# Patient Record
Sex: Female | Born: 1995 | Race: White | Hispanic: No | Marital: Single | Smoking: Never smoker
Health system: Southern US, Community
[De-identification: ages and names within clinical notes are randomized; demographics above are authoritative.]

## PROBLEM LIST (undated history)

## (undated) DIAGNOSIS — R569 Unspecified convulsions: Secondary | ICD-10-CM

## (undated) DIAGNOSIS — C4491 Basal cell carcinoma of skin, unspecified: Secondary | ICD-10-CM

## (undated) DIAGNOSIS — F329 Major depressive disorder, single episode, unspecified: Secondary | ICD-10-CM

## (undated) DIAGNOSIS — N920 Excessive and frequent menstruation with regular cycle: Secondary | ICD-10-CM

## (undated) DIAGNOSIS — G43909 Migraine, unspecified, not intractable, without status migrainosus: Secondary | ICD-10-CM

## (undated) DIAGNOSIS — F32A Depression, unspecified: Secondary | ICD-10-CM

## (undated) HISTORY — DX: Basal cell carcinoma of skin, unspecified: C44.91

## (undated) HISTORY — DX: Unspecified convulsions: R56.9

## (undated) HISTORY — DX: Excessive and frequent menstruation with regular cycle: N92.0

## (undated) HISTORY — PX: WISDOM TOOTH EXTRACTION: SHX21

## (undated) HISTORY — PX: BASAL CELL CARCINOMA EXCISION: SHX1214

## (undated) HISTORY — DX: Migraine, unspecified, not intractable, without status migrainosus: G43.909

---
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
## 2014-02-10 ENCOUNTER — Encounter: Payer: Self-pay | Admitting: Nurse Practitioner

## 2014-02-10 ENCOUNTER — Ambulatory Visit (INDEPENDENT_AMBULATORY_CARE_PROVIDER_SITE_OTHER): Payer: Managed Care, Other (non HMO) | Admitting: Nurse Practitioner

## 2014-02-10 VITALS — BP 116/64 | HR 64 | Ht 61.75 in | Wt 134.0 lb

## 2014-02-10 DIAGNOSIS — F439 Reaction to severe stress, unspecified: Secondary | ICD-10-CM

## 2014-02-10 DIAGNOSIS — Z309 Encounter for contraceptive management, unspecified: Secondary | ICD-10-CM

## 2014-02-10 DIAGNOSIS — Z733 Stress, not elsewhere classified: Secondary | ICD-10-CM

## 2014-02-10 MED ORDER — NORETHIN ACE-ETH ESTRAD-FE 1-20 MG-MCG PO TABS: 1.0000 | ORAL_TABLET | Freq: Every day | ORAL | Status: DC

## 2014-02-10 MED ORDER — SERTRALINE HCL 50 MG PO TABS: 50.0000 mg | ORAL_TABLET | Freq: Every day | ORAL | Status: DC

## 2014-02-10 NOTE — Patient Instructions (Signed)
Check on insurance coverage for Gardasil. - 3 injections.  Call for nurse visit to get Gardasil.

## 2014-02-10 NOTE — Progress Notes (Signed)
Patient ID: Erica Duarte, female   DOB: [birth year], 18 y.o.   MRN: [id number] S: 18 y.o. G0P0 Single Caucasian Fe here for consult visit for OCP.  Since on OCP- Azurette she is having regular menses. But in addition is now having increase in cramps and in mood changes.  Wants to change OCP.  LMP 02/02/14 and was normal.  Same partner since January.    Also to discuss her Zoloft.  She feels some better with stress and feels overall less tense.  Seems like dose in too low at 25 mg and wants to go up.  Sleep is better.  Takes her med's every am.  Plans to work part time and school part time for the summer.  She was having trouble dealing the first year of college and feels like summer school would be helpful.  She denies suicidal ideation.  She was going to check on Gardasil but has not contacted her parents insurance company.    O: Alert and oriented.  Able to express her concerns.  Does not appear anxious today.   Normal thought and affect.  Denies suicidal ideation.     A: Contraception with increase of cramps and PMS on this OCP  Situational stressors dealing with school  P: Will change her OCP to Junel Fe 1/20 for next 3 months and see how she does - if not tolerated to call back.  Will increase her Zoloft to 50 mg daily for next 3 months the consult visit again.  She will call back if her depression or anxiety symptoms worsens  She will also contact insurance company about Gardasil  Consult visit:  15 minutes face to face

## 2014-02-20 NOTE — Progress Notes (Signed)
Encounter reviewed by Dr. Brook Silva.  

## 2014-03-12 ENCOUNTER — Other Ambulatory Visit: Payer: Self-pay | Admitting: Nurse Practitioner

## 2014-03-14 NOTE — Telephone Encounter (Signed)
Last AEX: 11/09/13 Last refill: 02/10/14 #28 0 refill 3 month check: 05/13/14  Per last appt with Patty (02/10/14), it stated pt was going to try the OCP for 3 mths. Ordered 2 motre refills until her next appt   Encounter closed

## 2014-05-13 ENCOUNTER — Ambulatory Visit: Payer: Managed Care, Other (non HMO) | Admitting: Nurse Practitioner

## 2014-05-13 ENCOUNTER — Telehealth: Payer: Self-pay | Admitting: Nurse Practitioner

## 2014-05-13 NOTE — Telephone Encounter (Signed)
Pt DNKA  For reck appointment rescheduled to 05/19/14 with PG. [facility] fee applied.

## 2014-05-19 ENCOUNTER — Encounter: Payer: Self-pay | Admitting: Nurse Practitioner

## 2014-05-19 ENCOUNTER — Ambulatory Visit (INDEPENDENT_AMBULATORY_CARE_PROVIDER_SITE_OTHER): Payer: Managed Care, Other (non HMO) | Admitting: Nurse Practitioner

## 2014-05-19 VITALS — BP 116/72 | HR 72 | Ht 61.75 in | Wt 122.0 lb

## 2014-05-19 DIAGNOSIS — Z733 Stress, not elsewhere classified: Secondary | ICD-10-CM

## 2014-05-19 DIAGNOSIS — F439 Reaction to severe stress, unspecified: Secondary | ICD-10-CM

## 2014-05-19 DIAGNOSIS — Z3041 Encounter for surveillance of contraceptive pills: Secondary | ICD-10-CM

## 2014-05-19 MED ORDER — NORETHINDRONE ACET-ETHINYL EST 1.5-30 MG-MCG PO TABS: 1.0000 | ORAL_TABLET | Freq: Every day | ORAL | Status: DC

## 2014-05-19 MED ORDER — SERTRALINE HCL 25 MG PO TABS: 25.0000 mg | ORAL_TABLET | Freq: Every day | ORAL | Status: DC

## 2014-05-19 NOTE — Patient Instructions (Signed)
If problems call back.  Will try an increase in birth control pills for 3 months

## 2014-05-19 NOTE — Progress Notes (Signed)
Patient ID: Erica Duarte, female   DOB: September 03, 1996, 18 y.o.   MRN: [id number] S: 18 yo SW Few here to discuss her use of Zoloft. She feels some better with stress and feels overall less tense.  She did try 50 mg as suggested by her therapist but felt like it was too much and went back to 25 mg.  She is now much better.  Even with school starting back she has done well.  Recently some insomnia issues that are better with Tylenol PM prn.  Most of stressors is family related.  No other SE.  She also wants to change OCP to a stronger dose as she is having mid cycle BTB.  She denies having missed or late pills. No change of partner/ STD risk.  O: Well oriented.  Denies suicidal thoughts.  Appears to be calm and less tense.  A: Situational  Stressors and doing well on low dose Zoloft  Continues to see therapist at school  OCP for contraception with BTB  Plan:  Will continue with 25 mg Zoloft  Refill Zoloft 25 mg daily until next AEX   Will try 3 months of Loestrin 1.5/30 and see if BTB is better  Consult time: 15 minutes face to face

## 2014-05-22 NOTE — Progress Notes (Signed)
Encounter reviewed by Dr. Josefa Half. Will also refer patient to psychiatry for anxiety and continued care for this.

## 2014-08-22 ENCOUNTER — Other Ambulatory Visit: Payer: Self-pay | Admitting: Nurse Practitioner

## 2014-08-22 NOTE — Telephone Encounter (Signed)
Medication refill request: Loestrin 1.5/30 mg Last AEX:  11/09/13 with Ms. Patty Next AEX: No AEX scheduled for 2016 but is due in March of 2016 Last MMG (if hormonal medication request): N/A  Refill authorized: #90/1 rfs   Patient was seen for OV and her dosage of Loestrin was increased to help with her BTB.  S/w patient she says her BTB has improved tremendously.  S/w Ms. Patty she said it's okay for patient to have refills until her AEX in March 2016  Loestrin 1.5/30 mg #90/1 rfs sent to [facility], patient is aware.  Routed to provider for review, encounter closed.

## 2015-03-22 ENCOUNTER — Other Ambulatory Visit: Payer: Self-pay | Admitting: Nurse Practitioner

## 2015-03-22 NOTE — Telephone Encounter (Signed)
Patient called me back and I scheduled her for AEX 03/24/15 with Ms. Patty, patient said she just ran out of her zoloft today and is needing a refill, okay to send?

## 2015-03-22 NOTE — Telephone Encounter (Signed)
Medication refill request: Zoloft 25 mg  Last AEX:  11/09/13 with PG Next AEX: No AEX scheduled  Last MMG (if hormonal medication request): n/a Refill authorized: ?  Left Message To Call Back Re: Patient needs aex scheduled

## 2015-03-24 ENCOUNTER — Ambulatory Visit (INDEPENDENT_AMBULATORY_CARE_PROVIDER_SITE_OTHER): Payer: Managed Care, Other (non HMO) | Admitting: Nurse Practitioner

## 2015-03-24 ENCOUNTER — Other Ambulatory Visit: Payer: Self-pay | Admitting: Nurse Practitioner

## 2015-03-24 ENCOUNTER — Encounter: Payer: Self-pay | Admitting: Nurse Practitioner

## 2015-03-24 VITALS — BP 100/60 | HR 64 | Ht 61.75 in | Wt 116.0 lb

## 2015-03-24 DIAGNOSIS — Z0000 Encounter for general adult medical examination without abnormal findings: Secondary | ICD-10-CM | POA: Diagnosis not present

## 2015-03-24 DIAGNOSIS — Z01419 Encounter for gynecological examination (general) (routine) without abnormal findings: Secondary | ICD-10-CM | POA: Diagnosis not present

## 2015-03-24 LAB — POCT URINALYSIS DIPSTICK
Bilirubin, UA: NEGATIVE
Blood, UA: NEGATIVE
GLUCOSE UA: NEGATIVE
KETONES UA: NEGATIVE
LEUKOCYTES UA: NEGATIVE
Nitrite, UA: NEGATIVE
PH UA: 6.5
Protein, UA: NEGATIVE
Urobilinogen, UA: NEGATIVE

## 2015-03-24 MED ORDER — NORETHINDRONE ACET-ETHINYL EST 1.5-30 MG-MCG PO TABS: 1.0000 | ORAL_TABLET | Freq: Every day | ORAL | Status: DC

## 2015-03-24 MED ORDER — SERTRALINE HCL 25 MG PO TABS: 25.0000 mg | ORAL_TABLET | Freq: Every day | ORAL | Status: DC

## 2015-03-24 NOTE — Progress Notes (Signed)
Patient ID: Erica Duarte, female   DOB: 08/28/1996, 19 y.o.   MRN: [id number] 19 y.o. G0P0 Single  Caucasian Fe here for annual exam.  Menses at 4 days.  Flow is light, cramps ar minimal.  Same partner over a year.  Still feels well on Zoloft and no suicidal ideations.  Feels less tense about college. Doing well in college with GPA 3.5. States [facility] of anxiety with all siblings.   Patient's last menstrual period was 03/09/2015 (exact date).          Sexually active: Yes.  Same partner since last AEX. The current method of family planning is OCP (estrogen/progesterone).    Exercising: No.  The patient does not participate in regular exercise at present. Smoker:  no  Health Maintenance: Pap:  Never  TDaP:  03/2013 Labs:  HB:  12.5   Urine:  Negative    reports that she has never smoked. She has never used smokeless tobacco. She reports that she drinks alcohol. She reports that she does not use illicit drugs.  Past Medical History  Diagnosis Date  . Menorrhagia     Past Surgical History  Procedure Laterality Date  . Wisdom tooth extraction      Current Outpatient Prescriptions  Medication Sig Dispense Refill  . Norethindrone Acetate-Ethinyl Estradiol (MICROGESTIN) 1.5-30 MG-MCG tablet Take 1 tablet by mouth daily. 90 tablet 3  . sertraline (ZOLOFT) 25 MG tablet Take 1 tablet (25 mg total) by mouth daily. 90 tablet 3   No current facility-administered medications for this visit.    Family History  Problem Relation Age of Onset  . Hypertension Mother   . Thyroid disease Mother     hypo  . Heart attack Maternal Aunt   . Thyroid disease Maternal Aunt     hyper  . Breast cancer Maternal Grandmother 60  . Depression Maternal Grandmother   . Heart disease Maternal Grandfather     ROS:  Pertinent items are noted in HPI.  Otherwise, a comprehensive ROS was negative.  Exam:   BP 100/60 mmHg  Pulse 64  Ht 5' 1.75" (1.568 m)  Wt 116 lb (52.617 kg)  BMI 21.40 kg/m2  LMP 03/09/2015  (Exact Date) Height: 5' 1.75" (156.8 cm) Ht Readings from Last 3 Encounters:  03/24/15 5' 1.75" (1.568 m) (16 %*, Z = -1.00)  05/19/14 5' 1.75" (1.568 m) (16 %*, Z = -0.99)  02/10/14 5' 1.75" (1.568 m) (16 %*, Z = -0.99)   * Growth percentiles are based on CDC 2-20 Years data.    General appearance: alert, cooperative and appears stated age Head: Normocephalic, without obvious abnormality, atraumatic Neck: no adenopathy, supple, symmetrical, trachea midline and thyroid normal to inspection and palpation Lungs: clear to auscultation bilaterally Breasts: normal appearance, no masses or tenderness Heart: regular rate and rhythm Abdomen: soft, non-tender; no masses,  no organomegaly Extremities: extremities normal, atraumatic, no cyanosis or edema Skin: Skin color, texture, turgor normal. No rashes or lesions Lymph nodes: Cervical, supraclavicular, and axillary nodes normal. No abnormal inguinal nodes palpated Neurologic: Grossly normal   Pelvic: External genitalia:  no lesions              Urethra:  normal appearing urethra with no masses, tenderness or lesions              Bartholin's and Skene's: normal                 Vagina: normal appearing vagina with normal color and discharge, no  lesions              Cervix: anteverted              Pap taken: No. Bimanual Exam:  Uterus:  normal size, contour, position, consistency, mobility, non-tender              Adnexa: no mass, fullness, tenderness               Rectovaginal: Confirms               Anus:  normal sphincter tone, no lesions  Chaperone present:  no  A:  Well Woman with normal exam  Contraception needs Situational anxiety with college   P:   Reviewed health and wellness pertinent to exam  Pap smear as above  Declines need for STD's  Refill on OCP  Microgestin  X 1 year  Refill on Zoloft 25 mg daily - she is very aware to call back if any worsening of anxiety or depression.  Counseled on breast self exam,  STD prevention, HIV risk factors and prevention, use and side effects of TOCP's, adequate intake of calcium and vitamin D, diet and exercise return annually or prn  An After Visit Summary was printed and given to the patient.

## 2015-03-24 NOTE — Patient Instructions (Signed)
General topics  Next pap or exam is  due in 1 year Take a Women's multivitamin Take 1200 mg. of calcium daily - prefer dietary If any concerns in interim to call back  Breast Self-Awareness Practicing breast self-awareness may pick up problems early, prevent significant medical complications, and possibly save your life. By practicing breast self-awareness, you can become familiar with how your breasts look and feel and if your breasts are changing. This allows you to notice changes early. It can also offer you some reassurance that your breast health is good. One way to learn what is normal for your breasts and whether your breasts are changing is to do a breast self-exam. If you find a lump or something that was not present in the past, it is best to contact your caregiver right away. Other findings that should be evaluated by your caregiver include nipple discharge, especially if it is bloody; skin changes or reddening; areas where the skin seems to be pulled in (retracted); or new lumps and bumps. Breast pain is seldom associated with cancer (malignancy), but should also be evaluated by a caregiver. BREAST SELF-EXAM The best time to examine your breasts is 5 7 days after your menstrual period is over.  ExitCare Patient Information 2013 ExitCare[employer].   Exercise to Stay Healthy Exercise helps you become and stay healthy. EXERCISE IDEAS AND TIPS Choose exercises that:  You enjoy.  Fit into your day. You do not need to exercise really hard to be healthy. You can do exercises at a slow or medium level and stay healthy. You can:  Stretch before and after working out.  Try yoga, Pilates, or tai chi.  Lift weights.  Walk fast, swim, jog, run, climb stairs, bicycle, dance, or rollerskate.  Take aerobic classes. Exercises that burn about 150 calories:  Running 1  miles in 15 minutes.  Playing volleyball for 45 to 60 minutes.  Washing and waxing a car for 45 to 60  minutes.  Playing touch football for 45 minutes.  Walking 1  miles in 35 minutes.  Pushing a stroller 1  miles in 30 minutes.  Playing basketball for 30 minutes.  Raking leaves for 30 minutes.  Bicycling 5 miles in 30 minutes.  Walking 2 miles in 30 minutes.  Dancing for 30 minutes.  Shoveling snow for 15 minutes.  Swimming laps for 20 minutes.  Walking up stairs for 15 minutes.  Bicycling 4 miles in 15 minutes.  Gardening for 30 to 45 minutes.  Jumping rope for 15 minutes.  Washing windows or floors for 45 to 60 minutes. Document Released: 09/28/2010 Document Revised: 11/18/2011 Document Reviewed: 09/28/2010 ExitCare Patient Information 2013 ExitCare[employer].   Other topics ( that may be useful information):    Sexually Transmitted Disease Sexually transmitted disease (STD) refers to any infection that is passed from person to person during sexual activity. This may happen by way of saliva, semen, blood, vaginal mucus, or urine. Common STDs include:  Gonorrhea.  Chlamydia.  Syphilis.  HIV/AIDS.  Genital herpes.  Hepatitis B and C.  Trichomonas.  Human papillomavirus (HPV).  Pubic lice. CAUSES  An STD may be spread by bacteria, virus, or parasite. A person can get an STD by:  Sexual intercourse with an infected person.  Sharing sex toys with an infected person.  Sharing needles with an infected person.  Having intimate contact with the genitals, mouth, or rectal areas of an infected person. SYMPTOMS  Some people may not have any symptoms, but   they can still pass the infection to others. Different STDs have different symptoms. Symptoms include:  Painful or bloody urination.  Pain in the pelvis, abdomen, vagina, anus, throat, or eyes.  Skin rash, itching, irritation, growths, or sores (lesions). These usually occur in the genital or anal area.  Abnormal vaginal discharge.  Penile discharge in men.  Soft, flesh-colored skin growths in the  genital or anal area.  Fever.  Pain or bleeding during sexual intercourse.  Swollen glands in the groin area.  Yellow skin and eyes (jaundice). This is seen with hepatitis. DIAGNOSIS  To make a diagnosis, your caregiver may:  Take a medical history.  Perform a physical exam.  Take a specimen (culture) to be examined.  Examine a sample of discharge under a microscope.  Perform blood test TREATMENT   Chlamydia, gonorrhea, trichomonas, and syphilis can be cured with antibiotic medicine.  Genital herpes, hepatitis, and HIV can be treated, but not cured, with prescribed medicines. The medicines will lessen the symptoms.  Genital warts from HPV can be treated with medicine or by freezing, burning (electrocautery), or surgery. Warts may come back.  HPV is a virus and cannot be cured with medicine or surgery.However, abnormal areas may be followed very closely by your caregiver and may be removed from the cervix, vagina, or vulva through office procedures or surgery. If your diagnosis is confirmed, your recent sexual partners need treatment. This is true even if they are symptom-free or have a negative culture or evaluation. They should not have sex until their caregiver says it is okay. HOME CARE INSTRUCTIONS  All sexual partners should be informed, tested, and treated for all STDs.  Take your antibiotics as directed. Finish them even if you start to feel better.  Only take over-the-counter or prescription medicines for pain, discomfort, or fever as directed by your caregiver.  Rest.  Eat a balanced diet and drink enough fluids to keep your urine clear or pale yellow.  Do not have sex until treatment is completed and you have followed up with your caregiver. STDs should be checked after treatment.  Keep all follow-up appointments, Pap tests, and blood tests as directed by your caregiver.  Only use latex condoms and water-soluble lubricants during sexual activity. Do not use  petroleum jelly or oils.  Avoid alcohol and illegal drugs.  Get vaccinated for HPV and hepatitis. If you have not received these vaccines in the past, talk to your caregiver about whether one or both might be right for you.  Avoid risky sex practices that can break the skin. The only way to avoid getting an STD is to avoid all sexual activity.Latex condoms and dental dams (for oral sex) will help lessen the risk of getting an STD, but will not completely eliminate the risk. SEEK MEDICAL CARE IF:   You have a fever.  You have any new or worsening symptoms. Document Released: 11/16/2002 Document Revised: 11/18/2011 Document Reviewed: 11/23/2010 [facility] Patient Information 2013 [employer].    Domestic Abuse You are being battered or abused if someone close to you hits, pushes, or physically hurts you in any way. You also are being abused if you are forced into activities. You are being sexually abused if you are forced to have sexual contact of any kind. You are being emotionally abused if you are made to feel worthless or if you are constantly threatened. It is important to remember that help is available. No one has the right to abuse you. PREVENTION OF FURTHER  ABUSE  Learn the warning signs of danger. This varies with situations but may include: the use of alcohol, threats, isolation from friends and family, or forced sexual contact. Leave if you feel that violence is going to occur.  If you are attacked or beaten, report it to the police so the abuse is documented. You do not have to press charges. The police can protect you while you or the attackers are leaving. Get the officer's name and badge number and a copy of the report.  Find someone you can trust and tell them what is happening to you: your caregiver, a nurse, clergy member, close friend or family member. Feeling ashamed is natural, but remember that you have done nothing wrong. No one deserves abuse. Document Released:  08/23/2000 Document Revised: 11/18/2011 Document Reviewed: 11/01/2010 ExitCare Patient Information 2013 [employer]Care[employer].    How Much is Too Much Alcohol? Drinking too much alcohol can cause injury, accidents, and health problems. These types of problems can include:   Car crashes.  Falls.  Family fighting (domestic violence).  Drowning.  Fights.  Injuries.  Burns.  Damage to certain organs.  Having a baby with birth defects. ONE DRINK CAN BE TOO MUCH WHEN YOU ARE:  Working.  Pregnant or breastfeeding.  Taking medicines. Ask your doctor.  Driving or planning to drive. If you or someone you know has a drinking problem, get help from a doctor.  Document Released: 06/22/2009 Document Revised: 11/18/2011 Document Reviewed: 06/22/2009 ExitCare Patient Information 2013 [employer]Care[employer].   Smoking Hazards Smoking cigarettes is extremely bad for your health. Tobacco smoke has over 200 known poisons in it. There are over 60 chemicals in tobacco smoke that cause cancer. Some of the chemicals found in cigarette smoke include:   Cyanide.  Benzene.  Formaldehyde.  Methanol (wood alcohol).  Acetylene (fuel used in welding torches).  Ammonia. Cigarette smoke also contains the poisonous gases nitrogen oxide and carbon monoxide.  Cigarette smokers have an increased risk of many serious medical problems and Smoking causes approximately:  90% of all lung cancer deaths in men.  80% of all lung cancer deaths in women.  90% of deaths from chronic obstructive lung disease. Compared with nonsmokers, smoking increases the risk of:  Coronary heart disease by 2 to 4 times.  Stroke by 2 to 4 times.  Men developing lung cancer by 23 times.  Women developing lung cancer by 13 times.  Dying from chronic obstructive lung diseases by 12 times.  . Smoking is the most preventable cause of death and disease in our society.  WHY IS SMOKING ADDICTIVE?  Nicotine is the chemical  agent in tobacco that is capable of causing addiction or dependence.  When you smoke and inhale, nicotine is absorbed rapidly into the bloodstream through your lungs. Nicotine absorbed through the lungs is capable of creating a powerful addiction. Both inhaled and non-inhaled nicotine may be addictive.  Addiction studies of cigarettes and spit tobacco show that addiction to nicotine occurs mainly during the teen years, when young people begin using tobacco products. WHAT ARE THE BENEFITS OF QUITTING?  There are many health benefits to quitting smoking.   Likelihood of developing cancer and heart disease decreases. Health improvements are seen almost immediately.  Blood pressure, pulse rate, and breathing patterns start returning to normal soon after quitting. QUITTING SMOKING   American Lung Association - 1-800-LUNGUSA  American Cancer Society - 1-800-ACS-2345 Document Released: 10/03/2004 Document Revised: 11/18/2011 Document Reviewed: 06/07/2009 ExitCare Patient Information 2013 ExitCare,   LLC.   Stress Management Stress is a state of physical or mental tension that often results from changes in your life or normal routine. Some common causes of stress are:  Death of a loved one.  Injuries or severe illnesses.  Getting fired or changing jobs.  Moving into a new home. Other causes may be:  Sexual problems.  Business or financial losses.  Taking on a large debt.  Regular conflict with someone at home or at work.  Constant tiredness from lack of sleep. It is not just bad things that are stressful. It may be stressful to:  Win the lottery.  Get married.  Buy a new car. The amount of stress that can be easily tolerated varies from person to person. Changes generally cause stress, regardless of the types of change. Too much stress can affect your health. It may lead to physical or emotional problems. Too little stress (boredom) may also become stressful. SUGGESTIONS TO  REDUCE STRESS:  Talk things over with your family and friends. It often is helpful to share your concerns and worries. If you feel your problem is serious, you may want to get help from a professional counselor.  Consider your problems one at a time instead of lumping them all together. Trying to take care of everything at once may seem impossible. List all the things you need to do and then start with the most important one. Set a goal to accomplish 2 or 3 things each day. If you expect to do too many in a single day you will naturally fail, causing you to feel even more stressed.  Do not use alcohol or drugs to relieve stress. Although you may feel better for a short time, they do not remove the problems that caused the stress. They can also be habit forming.  Exercise regularly - at least 3 times per week. Physical exercise can help to relieve that "uptight" feeling and will relax you.  The shortest distance between despair and hope is often a good night's sleep.  Go to bed and get up on time allowing yourself time for appointments without being rushed.  Take a short "time-out" period from any stressful situation that occurs during the day. Close your eyes and take some deep breaths. Starting with the muscles in your face, tense them, hold it for a few seconds, then relax. Repeat this with the muscles in your neck, shoulders, hand, stomach, back and legs.  Take good care of yourself. Eat a balanced diet and get plenty of rest.  Schedule time for having fun. Take a break from your daily routine to relax. HOME CARE INSTRUCTIONS   Call if you feel overwhelmed by your problems and feel you can no longer manage them on your own.  Return immediately if you feel like hurting yourself or someone else. Document Released: 02/19/2001 Document Revised: 11/18/2011 Document Reviewed: 10/12/2007 ExitCare Patient Information 2013 ExitCare, LLC.  

## 2015-03-27 LAB — HEMOGLOBIN, FINGERSTICK: Hemoglobin, fingerstick: 12.5 g/dL (ref 12.0–16.0)

## 2015-03-28 NOTE — Progress Notes (Signed)
Encounter reviewed by Dr. Brook Amundson C. Silva.  

## 2015-07-03 ENCOUNTER — Other Ambulatory Visit: Payer: Self-pay | Admitting: *Deleted

## 2015-07-03 MED ORDER — NORETHINDRONE ACET-ETHINYL EST 1.5-30 MG-MCG PO TABS: 1.0000 | ORAL_TABLET | Freq: Every day | ORAL | Status: DC

## 2015-07-03 MED ORDER — SERTRALINE HCL 25 MG PO TABS: 25.0000 mg | ORAL_TABLET | Freq: Every day | ORAL | Status: DC

## 2015-07-03 NOTE — Telephone Encounter (Signed)
Medication refill request: sertraline and Microgestin  Last AEX:  03/24/15 PG Next AEX: 04/01/16 PG Last MMG (if hormonal medication request): none Refill authorized: both refills 03/24/15 #90tabs / 3R to [facility].  Request coming from [facility]  Both Rx approved to [facility] with refills to last until 03/2016 as prescribed.

## 2016-02-27 ENCOUNTER — Other Ambulatory Visit: Payer: Self-pay | Admitting: Nurse Practitioner

## 2016-02-27 NOTE — Telephone Encounter (Signed)
Medication refill request: Zoloft Last AEX:  03-24-15  Next AEX: 04-01-16 Last MMG (if hormonal medication request): N/A Refill authorized: please advise

## 2016-03-03 ENCOUNTER — Emergency Department (HOSPITAL_COMMUNITY): Payer: 59

## 2016-03-03 ENCOUNTER — Encounter (HOSPITAL_COMMUNITY): Payer: Self-pay

## 2016-03-03 ENCOUNTER — Observation Stay (HOSPITAL_COMMUNITY)
Admission: EM | Admit: 2016-03-03 | Discharge: 2016-03-04 | Disposition: A | Discharge status: Home / Self Care | Payer: 59 | Attending: Internal Medicine | Admitting: Internal Medicine

## 2016-03-03 DIAGNOSIS — F329 Major depressive disorder, single episode, unspecified: Secondary | ICD-10-CM | POA: Insufficient documentation

## 2016-03-03 DIAGNOSIS — F32A Depression, unspecified: Secondary | ICD-10-CM | POA: Diagnosis present

## 2016-03-03 DIAGNOSIS — G43109 Migraine with aura, not intractable, without status migrainosus: Secondary | ICD-10-CM | POA: Diagnosis present

## 2016-03-03 DIAGNOSIS — G43909 Migraine, unspecified, not intractable, without status migrainosus: Secondary | ICD-10-CM | POA: Diagnosis not present

## 2016-03-03 DIAGNOSIS — F1290 Cannabis use, unspecified, uncomplicated: Secondary | ICD-10-CM | POA: Insufficient documentation

## 2016-03-03 DIAGNOSIS — F419 Anxiety disorder, unspecified: Secondary | ICD-10-CM | POA: Diagnosis present

## 2016-03-03 DIAGNOSIS — R569 Unspecified convulsions: Secondary | ICD-10-CM | POA: Diagnosis not present

## 2016-03-03 HISTORY — DX: Major depressive disorder, single episode, unspecified: F32.9

## 2016-03-03 HISTORY — DX: Depression, unspecified: F32.A

## 2016-03-03 LAB — RAPID URINE DRUG SCREEN, HOSP PERFORMED
Amphetamines: NOT DETECTED
Barbiturates: NOT DETECTED
Benzodiazepines: NOT DETECTED
Cocaine: NOT DETECTED
Opiates: NOT DETECTED
Tetrahydrocannabinol: POSITIVE — AB

## 2016-03-03 LAB — BASIC METABOLIC PANEL
Anion gap: 9 (ref 5–15)
BUN: 7 mg/dL (ref 6–20)
CO2: 22 mmol/L (ref 22–32)
Calcium: 9.3 mg/dL (ref 8.9–10.3)
Chloride: 104 mmol/L (ref 101–111)
Creatinine, Ser: 0.66 mg/dL (ref 0.44–1.00)
GFR calc Af Amer: >60 mL/min (ref >60)
GLUCOSE: 87 mg/dL (ref 65–99)
Potassium: 5 mmol/L (ref 3.5–5.1)
Sodium: 135 mmol/L (ref 135–145)

## 2016-03-03 LAB — CBC WITH DIFFERENTIAL/PLATELET
BASOS ABS: 0 10*3/uL (ref 0.0–0.1)
Basophils Relative: 0 %
EOS PCT: 0 %
Eosinophils Absolute: 0 10*3/uL (ref 0.0–0.7)
HCT: 39.9 % (ref 36.0–46.0)
Hemoglobin: 13.5 g/dL (ref 12.0–15.0)
LYMPHS ABS: 1.3 10*3/uL (ref 0.7–4.0)
LYMPHS PCT: 31 %
MCH: 30.3 pg (ref 26.0–34.0)
MCHC: 33.8 g/dL (ref 30.0–36.0)
MCV: 89.5 fL (ref 78.0–100.0)
MONO ABS: 0.2 10*3/uL (ref 0.1–1.0)
Monocytes Relative: 5 %
Neutro Abs: 2.8 10*3/uL (ref 1.7–7.7)
Neutrophils Relative %: 64 %
PLATELETS: 212 10*3/uL (ref 150–400)
RBC: 4.46 MIL/uL (ref 3.87–5.11)
RDW: 13.2 % (ref 11.5–15.5)
WBC: 4.3 10*3/uL (ref 4.0–10.5)

## 2016-03-03 LAB — TSH: TSH: 0.736 u[IU]/mL (ref 0.350–4.500)

## 2016-03-03 LAB — MAGNESIUM: Magnesium: 2 mg/dL (ref 1.7–2.4)

## 2016-03-03 MED ORDER — ACETAMINOPHEN 650 MG RE SUPP: 650.0000 mg | Freq: Four times a day (QID) | RECTAL | Status: DC | PRN

## 2016-03-03 MED ORDER — ONDANSETRON HCL 4 MG PO TABS: 4.0000 mg | ORAL_TABLET | Freq: Four times a day (QID) | ORAL | Status: DC | PRN

## 2016-03-03 MED ORDER — SODIUM CHLORIDE 0.9 % IV SOLN: 250.0000 mL | INTRAVENOUS | Status: DC | PRN

## 2016-03-03 MED ORDER — NORETHINDRONE ACET-ETHINYL EST 1.5-30 MG-MCG PO TABS: 1.0000 | ORAL_TABLET | Freq: Every day | ORAL | Status: DC

## 2016-03-03 MED ORDER — ONDANSETRON HCL 4 MG/2ML IJ SOLN: 4.0000 mg | Freq: Four times a day (QID) | INTRAMUSCULAR | Status: DC | PRN

## 2016-03-03 MED ORDER — KETOROLAC TROMETHAMINE 30 MG/ML IJ SOLN: 30.0000 mg | Freq: Four times a day (QID) | INTRAMUSCULAR | Status: DC | PRN

## 2016-03-03 MED ORDER — SERTRALINE HCL 50 MG PO TABS
25.0000 mg | ORAL_TABLET | Freq: Every day | ORAL | Status: DC
Start: 2016-03-04 — End: 2016-03-04
  Administered 2016-03-04: 25 mg via ORAL
  Filled 2016-03-03: qty 1

## 2016-03-03 MED ORDER — SODIUM CHLORIDE 0.9% FLUSH
3.0000 mL | Freq: Two times a day (BID) | INTRAVENOUS | Status: DC
  Administered 2016-03-04: 3 mL via INTRAVENOUS

## 2016-03-03 MED ORDER — SODIUM CHLORIDE 0.9% FLUSH
3.0000 mL | Freq: Two times a day (BID) | INTRAVENOUS | Status: DC
  Administered 2016-03-03 – 2016-03-04 (×2): 3 mL via INTRAVENOUS

## 2016-03-03 MED ORDER — SODIUM CHLORIDE 0.9% FLUSH: 3.0000 mL | INTRAVENOUS | Status: DC | PRN

## 2016-03-03 MED ORDER — ACETAMINOPHEN 325 MG PO TABS
650.0000 mg | ORAL_TABLET | Freq: Four times a day (QID) | ORAL | Status: DC | PRN
  Administered 2016-03-03: 650 mg via ORAL
  Filled 2016-03-03: qty 2

## 2016-03-03 NOTE — ED Notes (Signed)
Dinner tray ordered for pt

## 2016-03-03 NOTE — Progress Notes (Addendum)
Patient arrived to unit alert and oriented x 4, pt denies pain. Patient medications and orders reviewed and verbally confirmed by patient. Patient oriented to unit, call light and telephone placed within patients reach. Patient eating dinner. Patient clothing, cell phone/charger and purse at bedside.  Patient states nothing valuable on her that needs to be sent home. Patient calm and cooperative. RN will continue to monitor patient.

## 2016-03-03 NOTE — ED Provider Notes (Signed)
CSN: [id number]     Arrival date & time 03/03/16  1303 History   First MD Initiated Contact with Patient 03/03/16 1304     Chief Complaint  Patient presents with  . Seizures      HPI Pt was in drive through line at [employer], had 4 other people with her, witnessed Seizure. No h/o seizure. Pt reports at onset she was looking right and when trying to look left eyes kept turning back to right, dizziness. Pt is having trouble remembering details, time/date, address.  Patient has had more frequent headaches over the last few weeks.  No fever chills.  No recent head injury. Past Medical History  Diagnosis Date  . Menorrhagia   . Depression   . Seizures [facility])    Past Surgical History  Procedure Laterality Date  . Wisdom tooth extraction     Family History  Problem Relation Age of Onset  . Hypertension Mother   . Thyroid disease Mother     hypo  . Hyperlipidemia Mother   . Heart attack Maternal Aunt   . Thyroid disease Maternal Aunt     hyper  . Breast cancer Maternal Grandmother 60  . Depression Maternal Grandmother   . Epilepsy Maternal Grandmother   . Heart disease Maternal Grandfather    Social History  Substance Use Topics  . Smoking status: Never Smoker   . Smokeless tobacco: Never Used  . Alcohol Use: 0.0 oz/week    0 Standard drinks or equivalent per week     Comment: Occasional social use.   OB History    Gravida Para Term Preterm AB TAB SAB Ectopic Multiple Living   0 0             Review of Systems All other systems reviewed and are negative   Allergies  Review of patient's allergies indicates no known allergies.  Home Medications   Prior to Admission medications   Medication Sig Start Date End Date Taking? Authorizing Provider  Norethindrone Acetate-Ethinyl Estradiol (MICROGESTIN) 1.5-30 MG-MCG tablet Take 1 tablet by mouth daily. 07/03/15  Yes Kem Boroughs, FNP  sertraline (ZOLOFT) 25 MG tablet Take 1 tablet by mouth  daily 02/27/16  Yes Kem Boroughs, FNP   BP 110/60 mmHg  Pulse 67  Temp(Src) 98 F (36.7 C) (Oral)  Resp 20  Ht 5\' 1"  (1.549 m)  Wt 107 lb 9.4 oz (48.8 kg)  BMI 20.34 kg/m2  SpO2 98%  LMP 02/28/2016 Physical Exam Physical Exam  Nursing note and vitals reviewed. Constitutional: She is oriented to person, place, and time. She appears well-developed and well-nourished. No distress.  HENT:  Head: Normocephalic and atraumatic.  Eyes: Pupils are equal, round, and reactive to light.  Neck: Normal range of motion.  Cardiovascular: Normal rate and intact distal pulses.   Pulmonary/Chest: No respiratory distress.  Abdominal: Normal appearance. She exhibits no distension.  Musculoskeletal: Normal range of motion.  Neurological: She is alert and oriented to person, place, and time. No cranial nerve deficit.  no motor or sensory deficit.  Gait is normal. Skin: Skin is warm and dry. No rash noted.  Psychiatric: She has a normal mood and affect. Her behavior is normal.   ED Course  Procedures (including critical care time) Labs Review  Results for orders placed or performed during the hospital encounter of [id number]  Basic metabolic panel  Result Value Ref Range   Sodium 135 135 - 145 mmol/L   Potassium 5.0 3.5 - 5.1 mmol/L  Chloride 104 101 - 111 mmol/L   CO2 22 22 - 32 mmol/L   Glucose, Bld 87 65 - 99 mg/dL   BUN 7 6 - 20 mg/dL   Creatinine, Ser 0.66 0.44 - 1.00 mg/dL   Calcium 9.3 8.9 - 10.3 mg/dL   GFR calc non Af Amer >60 >60 mL/min   GFR calc Af Amer >60 >60 mL/min   Anion gap 9 5 - 15  CBC with Differential/Platelet  Result Value Ref Range   WBC 4.3 4.0 - 10.5 K/uL   RBC 4.46 3.87 - 5.11 MIL/uL   Hemoglobin 13.5 12.0 - 15.0 g/dL   HCT 39.9 36.0 - 46.0 %   MCV 89.5 78.0 - 100.0 fL   MCH 30.3 26.0 - 34.0 pg   MCHC 33.8 30.0 - 36.0 g/dL   RDW 13.2 11.5 - 15.5 %   Platelets 212 150 - 400 K/uL   Neutrophils Relative % 64 %   Neutro Abs 2.8 1.7 - 7.7 K/uL   Lymphocytes Relative 31 %   Lymphs Abs  1.3 0.7 - 4.0 K/uL   Monocytes Relative 5 %   Monocytes Absolute 0.2 0.1 - 1.0 K/uL   Eosinophils Relative 0 %   Eosinophils Absolute 0.0 0.0 - 0.7 K/uL   Basophils Relative 0 %   Basophils Absolute 0.0 0.0 - 0.1 K/uL  Urine rapid drug screen (hosp performed)  Result Value Ref Range   Opiates NONE DETECTED NONE DETECTED   Cocaine NONE DETECTED NONE DETECTED   Benzodiazepines NONE DETECTED NONE DETECTED   Amphetamines NONE DETECTED NONE DETECTED   Tetrahydrocannabinol POSITIVE (A) NONE DETECTED   Barbiturates NONE DETECTED NONE DETECTED  Magnesium  Result Value Ref Range   Magnesium 2.0 1.7 - 2.4 mg/dL  TSH  Result Value Ref Range   TSH 0.736 0.350 - 4.500 uIU/mL   Dg Chest 2 View  03/03/2016  CLINICAL DATA:  Headache.  Seizure. EXAM: CHEST  2 VIEW COMPARISON:  None. FINDINGS: Normal heart size. Normal mediastinal contour. No pneumothorax. No pleural effusion. Lungs appear clear, with no acute consolidative airspace disease and no pulmonary edema. IMPRESSION: No active cardiopulmonary disease. Electronically Signed   By: Ilona Sorrel M.D.   On: 03/03/2016 14:17   Ct Head Wo Contrast  03/03/2016  CLINICAL DATA:  Pt states she remembers looking to right and being unable to look to the left and being unable to speak then was told she had a seizure. Pt has headache all over. EXAM: CT HEAD WITHOUT CONTRAST TECHNIQUE: Contiguous axial images were obtained from the base of the skull through the vertex without intravenous contrast. COMPARISON:  None. FINDINGS: No acute intracranial hemorrhage. No focal mass lesion. No CT evidence of acute infarction. No midline shift or mass effect. No hydrocephalus. Basilar cisterns are patent. Paranasal sinuses and mastoid air cells are clear. Orbits are clear. IMPRESSION: Normal head CT. Electronically Signed   By: Suzy Bouchard M.D.   On: 03/03/2016 15:25      I spoke with the neurologist who will come see the patient in the emergency room and recommend  disposition.  MDM   Final diagnoses:  Seizure ([place])        Leonard Schwartz, MD 03/06/16 [phone]

## 2016-03-03 NOTE — ED Notes (Signed)
Received report from Maryfrances Bunnell, RN

## 2016-03-03 NOTE — ED Notes (Signed)
Dr. Leonel Ramsay in to assess pt at this time.  Pt remains alert and oriented x's 3.  Pt's mom at bedside

## 2016-03-03 NOTE — ED Notes (Signed)
Pt transported to 5M10.  Did not receive call from receiving nurse.  Pt transported to 5M10 on cardiac monitor

## 2016-03-03 NOTE — ED Notes (Signed)
To room via EMS.  Pt was in drive through line at [employer], had 4 other people with her, witnessed  Seizure.  No h/o seizure.  Pt reports at onset she was looking right and when trying to look left eyes kept turning back to right, dizziness.  Pt is having trouble remembering details, time/date, address.

## 2016-03-03 NOTE — H&P (Signed)
History and Physical:    Erica Duarte   [id number] DOB: 04-28-1996 DOA: 03/03/2016  Referring MD/provider: Dr. Wilson Singer PCP: No PCP Per Patient   Patient coming from:  Home  Chief Complaint: Altered mental status  History of Present Illness:   Erica Duarte is an 20 y.o. female with a PMH of depression treated with Zoloft for the past 3 years who was in her usual state of health when she had the sudden onset of altered mental status. She was going through the drive through with friends, and when she attempted to look to the left to place her order, she could not move her gaze in that direction and subsequently felt her head turning to the right. She says she was unable to talk, and heard her friends tell the clerk at the drive through to call an ambulance. She came to when the ambulance arrived. She did not lose bladder or bowel function and there was no mouth trauma. She has never had a similar event, though she does report headaches for the last 1-2 years and occasional episodes of "tunnel vision". None of her friends who witnessed the event were present at the bedside, but her mother says that they told her that Najja said something "off the wall "and began rubbing her eye repetitively. There was no frank tonic-clonic convulsive activity. The patient denies any drug use other than occasional marijuana and occasional alcohol, but not daily use. She has not had any recent medication changes. She has not traveled recently, and has no recent insect bites or rashes on the skin. Family history was positive for a maternal grandmother having epilepsy, and a half-sister having been evaluated for similar vision issues with lapses of time, although no clear diagnosis of seizure was diagnosed.  ED Course: A CT of the head was obtained which was unremarkable. Neurological consultation was requested. No further events were reported.  ROS:   Review of Systems  Constitutional: Positive for diaphoresis.  Negative for fever, chills and malaise/fatigue.       Reported an episode of sweating associated with blurry vision 1 day prior to admission.  HENT: Negative for hearing loss.        Headaches for the last 1-2 years, not associated with menses.  Eyes: Positive for blurred vision. Negative for double vision and pain.  Respiratory: Negative.   Cardiovascular: Negative.   Gastrointestinal: Negative.   Genitourinary: Negative.   Musculoskeletal: Negative.   Skin: Negative.   Neurological: Positive for dizziness, loss of consciousness and headaches. Negative for weakness.  Endo/Heme/Allergies: Negative.   Psychiatric/Behavioral: Positive for depression.    All other systems were reviewed are are negative.  Past Medical History:   Past Medical History  Diagnosis Date  . Menorrhagia   . Depression     Past Surgical History:   Past Surgical History  Procedure Laterality Date  . Wisdom tooth extraction      Social History:   Social History   Social History  . Marital Status: Single    Spouse Name: N/A  . Number of Children: 0  . Years of Education: N/A   Occupational History  . Student    Social History Main Topics  . Smoking status: Never Smoker   . Smokeless tobacco: Never Used  . Alcohol Use: 0.0 oz/week    0 Standard drinks or equivalent per week     Comment: Occasional social use.  . Drug Use: Yes    Special: Marijuana  Comment: Marijuana  . Sexual Activity: Yes    Birth Control/ Protection: Condom, OCP   Other Topics Concern  . 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 She is single.    Allergies   Review of patient's allergies indicates no known allergies.  Family history:   Family History  Problem Relation Age of Onset  . Hypertension Mother   . Thyroid disease Mother     hypo  . Heart attack Maternal Aunt   .  Thyroid disease Maternal Aunt     hyper  . Breast cancer Maternal Grandmother 60  . Depression Maternal Grandmother   . Heart disease Maternal Grandfather   . Epilepsy Maternal Grandmother     Current Medications:   Prior to Admission medications   Medication Sig Start Date End Date Taking? Authorizing Provider  Norethindrone Acetate-Ethinyl Estradiol (MICROGESTIN) 1.5-30 MG-MCG tablet Take 1 tablet by mouth daily. 07/03/15  Yes Kem Boroughs, FNP  sertraline (ZOLOFT) 25 MG tablet Take 1 tablet by mouth  daily 02/27/16  Yes Kem Boroughs, FNP    Physical Exam:   Filed Vitals:   03/03/16 1530 03/03/16 1600 03/03/16 1615 03/03/16 1630  BP: 127/84 114/69  116/73  Pulse: 76 70 72 73  Temp:      TempSrc:      Resp: 15 17 22 14   Height:      Weight:      SpO2: 100% 99% 100% 100%     Physical Exam: Blood pressure 116/73, pulse 73, temperature 97.8 F (36.6 C), temperature source Oral, resp. rate 14, height 5\' 1"  (1.549 m), weight 52.164 kg (115 lb), last menstrual period 02/28/2016, SpO2 100 %. Gen: No acute distress.Well-developed, well-nourished. Head: Normocephalic, atraumatic. Eyes: Pupils equal, round and reactive to light. Extraocular movements intact.  Sclerae nonicteric. Mouth: Oropharynx reveals moist mucous membranes. Neck: Supple, no thyromegaly, no lymphadenopathy, no jugular venous distention. Chest: Lungs are clear to auscultation with good air movement. No rales, rhonchi or wheezes.  CV: Heart sounds are regular with an S1, S2. No murmurs, rubs, clicks, or gallops.  Abdomen: Soft, nontender, nondistended with normal active bowel sounds. Extremities: Extremities are without clubbing, edema, or cyanosis. Pedal pulses 2+.  Skin: Warm and dry. No rashes, lesions or wounds. Tattoo right posterior shoulder. Neuro: Alert and oriented times 3; grossly nonfocal.  Psych: Insight is good and judgment is appropriate. Mood and affect normal.   Data Review:     Labs: Basic Metabolic Panel:  Recent Labs Lab 03/03/16 1319  NA 135  K 5.0  CL 104  CO2 22  GLUCOSE 87  BUN 7  CREATININE 0.66  CALCIUM 9.3   Liver Function Tests: No results for input(s): AST, ALT, ALKPHOS, BILITOT, PROT, ALBUMIN in the last 168 hours. No results for input(s): LIPASE, AMYLASE in the last 168 hours. No results for input(s): AMMONIA in the last 168 hours. CBC:  Recent Labs Lab 03/03/16 1319  WBC 4.3  NEUTROABS 2.8  HGB 13.5  HCT 39.9  MCV 89.5  PLT 212    Urinalysis    Component Value Date/Time   BILIRUBINUR neg 03/24/2015 1636   PROTEINUR neg 03/24/2015 1636   UROBILINOGEN negative 03/24/2015 1636   NITRITE neg 03/24/2015 1636   LEUKOCYTESUR Negative 03/24/2015 1636     Radiographic Studies: Dg Chest 2 View  03/03/2016  CLINICAL DATA:  Headache.  Seizure. EXAM: CHEST  2 VIEW COMPARISON:  None. FINDINGS: Normal heart size. Normal mediastinal contour. No pneumothorax. No pleural effusion. Lungs appear clear, with no acute consolidative airspace disease and no pulmonary edema. IMPRESSION: No active cardiopulmonary disease. Electronically Signed   By: Ilona Sorrel M.D.   On: 03/03/2016 14:17   Ct Head Wo Contrast  03/03/2016  CLINICAL DATA:  Pt states she remembers looking to right and being unable to look to the left and being unable to speak then was told she had a seizure. Pt has headache all over. EXAM: CT HEAD WITHOUT CONTRAST TECHNIQUE: Contiguous axial images were obtained from the base of the skull through the vertex without intravenous contrast. COMPARISON:  None. FINDINGS: No acute intracranial hemorrhage. No focal mass lesion. No CT evidence of acute infarction. No midline shift or mass effect. No hydrocephalus. Basilar cisterns are patent. Paranasal sinuses and mastoid air cells are clear. Orbits are clear. IMPRESSION: Normal head CT. Electronically Signed   By: Suzy Bouchard M.D.   On: 03/03/2016 15:25    EKG: None  performed.   Assessment/Plan:   Principal Problem:   Partial seizure (HCC)versus complicated migraine  Seen by neurologist. Agree with plan to obtain MRI/EEG. No antiepileptic medications ordered at present. Would use Toradol as needed for headache. Avoid opiates.  Active Problems:   Depression Continue Zoloft..  Other information:   DVT prophylaxis: SCDs/early ambulation. Code Status: Full code. Family Communication: Mother updated at the bedside. Disposition Plan: Home 03/04/16. Consults called: Dr. Leonel Ramsay, neurology. Admission status: Observation.  Time spent: One hour.  Matteus Mcnelly [facility] Pager [phone] Cell: [phone]   If 7PM-7AM, please contact night-coverage [email or web address] Password [facility] 03/03/2016, 6:36 PM

## 2016-03-03 NOTE — Consult Note (Signed)
Neurology Consultation Reason for Consult: Seizure Referring Physician: Audie Pinto  CC: Seizure  History is obtained from: Patient  HPI: Erica Duarte is a 20 y.o. female who is sitting down in the car when she had the sensation that she was looking to the right even though she was trying to look to the left. She then felt her head turned to the right. She hurt her friends say that someone need to call 911 missed last thing she remembers before being in the back of an ambulance. Witnesses report seizure activity. She currently has returned to baseline.  She denies any drug use other than occasional marijuana and occasional alcohol.  Of note, her grandmother began getting seizures when she was pregnant with her first child and continued to get seizure throughout her life.  ROS: A 14 point ROS was performed and is negative except as noted in the HPI.   Past Medical History  Diagnosis Date  . Menorrhagia      Family History  Problem Relation Age of Onset  . Hypertension Mother   . Thyroid disease Mother     hypo  . Heart attack Maternal Aunt   . Thyroid disease Maternal Aunt     hyper  . Breast cancer Maternal Grandmother 60  . Depression Maternal Grandmother   . Heart disease Maternal Grandfather   Grandmother-seizures   Social History:  reports that she has never smoked. She has never used smokeless tobacco. She reports that she drinks alcohol. She reports that she uses illicit drugs (Marijuana).   Exam: Current vital signs: BP 116/73 mmHg  Pulse 73  Temp(Src) 97.8 F (36.6 C) (Oral)  Resp 14  Ht 5\' 1"  (1.549 m)  Wt 52.164 kg (115 lb)  BMI 21.74 kg/m2  SpO2 100%  LMP 02/28/2016 Vital signs in last 24 hours: Temp:  [97.8 F (36.6 C)] 97.8 F (36.6 C) (06/25 1311) Pulse Rate:  [62-83] 73 (06/25 1630) Resp:  [14-22] 14 (06/25 1630) BP: (112-136)/(58-84) 116/73 mmHg (06/25 1630) SpO2:  [98 %-100 %] 100 % (06/25 1630) Weight:  [52.164 kg (115 lb)] 52.164 kg (115 lb)  (06/25 1311)   Physical Exam  Constitutional: Appears well-developed and well-nourished.  Psych: Affect appropriate to situation Eyes: No scleral injection HENT: No OP obstrucion Head: Normocephalic.  Cardiovascular: Normal rate and regular rhythm.  Respiratory: Effort normal and breath sounds normal to anterior ascultation GI: Soft.  No distension. There is no tenderness.  Skin: WDI  Neuro: Mental Status: Patient is awake, alert, oriented to person, place, month, year, and situation. Patient is able to give a clear and coherent history. No signs of aphasia or neglect Cranial Nerves: II: Visual Fields are full. Pupils are equal, round, and reactive to light.   III,IV, VI: EOMI without ptosis or diploplia.  V: Facial sensation is symmetric to temperature VII: Facial movement is symmetric.  VIII: hearing is intact to voice X: Uvula elevates symmetrically XI: Shoulder shrug is symmetric. XII: tongue is midline without atrophy or fasciculations.  Motor: Tone is normal. Bulk is normal. 5/5 strength was present in all four extremities.  Sensory: Sensation is symmetric to light touch and temperature in the arms and legs. Cerebellar: FNF  intact bilaterally    I have reviewed labs in epic and the results pertinent to this consultation are: BMP-unremarkable  I have reviewed the images obtained: CT head-unremarkable  Impression: 20 year old female with new onset seizure which is partial by history(initiated with head turning). I would favor further workup with  MRI and EEG. I would not favor starting antiepileptic therapy at this time, but certainly if there was evidence of for predisposition on MRI or EEG then I would favor starting seizure medicines.  Recommendations: 1) MRI brain 2) EEG 3) if she has further events, or if predisposition is demonstrated on either MRI or EEG then would consider starting Keppra.   Roland Rack, MD [facility] [phone]  If 7pm- 7am, please page neurology on call as listed in [place].

## 2016-03-04 ENCOUNTER — Observation Stay (HOSPITAL_BASED_OUTPATIENT_CLINIC_OR_DEPARTMENT_OTHER)
Admit: 2016-03-04 | Discharge: 2016-03-04 | Disposition: A | Discharge status: Home / Self Care | Payer: 59 | Attending: Neurology | Admitting: Neurology

## 2016-03-04 ENCOUNTER — Other Ambulatory Visit: Payer: Self-pay | Admitting: Internal Medicine

## 2016-03-04 DIAGNOSIS — R569 Unspecified convulsions: Secondary | ICD-10-CM | POA: Diagnosis not present

## 2016-03-04 DIAGNOSIS — G43909 Migraine, unspecified, not intractable, without status migrainosus: Secondary | ICD-10-CM | POA: Diagnosis not present

## 2016-03-04 DIAGNOSIS — F329 Major depressive disorder, single episode, unspecified: Secondary | ICD-10-CM | POA: Diagnosis not present

## 2016-03-04 NOTE — Progress Notes (Signed)
Patient notified that pharmacy does not carry her birth control. Patient v/u.

## 2016-03-04 NOTE — Progress Notes (Signed)
RN called MRI 2x and currently one MRI machine is working at this time and pt is on the list for MRI. Pt's mother is anxious at this time for test to be done and wish to speak with MD to see if this can be done outpatient. MD paged to call pt's room.   Ave Filter, RN

## 2016-03-04 NOTE — Discharge Summary (Addendum)
Physician Discharge Summary  Barbar Brede MRN: [id number] DOB/AGE: January 04, 1996 20 y.o.  PCP: No PCP Per Patient   Admit date: 03/03/2016 Discharge date: 03/04/2016  Discharge Diagnoses:     Principal Problem:   Partial seizure ([place]) Active Problems:   Depression   Complicated migraine    Follow-up recommendations Follow-up with PCP in 3-5 days , including all  additional recommended appointments as below Follow-up CBC, CMP in 3-5 days No driving until cleared by neurology in the outpatient setting, or MRIs done within the next 2 weeks This was discussed with the patient and her mother      Current Discharge Medication List    CONTINUE these medications which have NOT CHANGED   Details  Norethindrone Acetate-Ethinyl Estradiol (MICROGESTIN) 1.5-30 MG-MCG tablet Take 1 tablet by mouth daily. Qty: 90 tablet, Refills: 2    sertraline (ZOLOFT) 25 MG tablet Take 1 tablet by mouth  daily Qty: 90 tablet, Refills: 0         Discharge Condition: Stable Discharge Instructions Get Medicines reviewed and adjusted: Please take all your medications with you for your next visit with your Primary MD  Please request your Primary MD to go over all hospital tests and procedure/radiological results at the follow up, please ask your Primary MD to get all Hospital records sent to his/her office.  If you experience worsening of your admission symptoms, develop shortness of breath, life threatening emergency, suicidal or homicidal thoughts you must seek medical attention immediately by calling 911 or calling your MD immediately if symptoms less severe.  You must read complete instructions/literature along with all the possible adverse reactions/side effects for all the Medicines you take and that have been prescribed to you. Take any new Medicines after you have completely understood and accpet all the possible adverse reactions/side effects.   Do not drive when taking Pain medications.    Do not take more than prescribed Pain, Sleep and Anxiety Medications  Special Instructions: If you have smoked or chewed Tobacco in the last 2 yrs please stop smoking, stop any regular Alcohol and or any Recreational drug use.  Wear Seat belts while driving.  Please note  You were cared for by a hospitalist during your hospital stay. Once you are discharged, your primary care physician will handle any further medical issues. Please note that NO REFILLS for any discharge medications will be authorized once you are discharged, as it is imperative that you return to your primary care physician (or establish a relationship with a primary care physician if you do not have one) for your aftercare needs so that they can reassess your need for medications and monitor your lab values.     No Known Allergies    Disposition:    Consults: Neurology   Significant Diagnostic Studies:  Dg Chest 2 View  03/03/2016  CLINICAL DATA:  Headache.  Seizure. EXAM: CHEST  2 VIEW COMPARISON:  None. FINDINGS: Normal heart size. Normal mediastinal contour. No pneumothorax. No pleural effusion. Lungs appear clear, with no acute consolidative airspace disease and no pulmonary edema. IMPRESSION: No active cardiopulmonary disease. Electronically Signed   By: Ilona Sorrel M.D.   On: 03/03/2016 14:17   Ct Head Wo Contrast  03/03/2016  CLINICAL DATA:  Pt states she remembers looking to right and being unable to look to the left and being unable to speak then was told she had a seizure. Pt has headache all over. EXAM: CT HEAD WITHOUT CONTRAST TECHNIQUE: Contiguous axial images were obtained  from the base of the skull through the vertex without intravenous contrast. COMPARISON:  None. FINDINGS: No acute intracranial hemorrhage. No focal mass lesion. No CT evidence of acute infarction. No midline shift or mass effect. No hydrocephalus. Basilar cisterns are patent. Paranasal sinuses and mastoid air cells are clear.  Orbits are clear. IMPRESSION: Normal head CT. Electronically Signed   By: Suzy Bouchard M.D.   On: 03/03/2016 15:25        Filed Weights   03/03/16 1311 03/03/16 2014  Weight: 52.164 kg (115 lb) 48.8 kg (107 lb 9.4 oz)     Microbiology: No results found for this or any previous visit (from the past 240 hour(s)).     Blood Culture No results found for: SDES, SPECREQUEST, CULT, REPTSTATUS    Labs: Results for orders placed or performed during the hospital encounter of 03/03/16 (from the past 48 hour(s))  Basic metabolic panel     Status: None   Collection Time: 03/03/16  1:19 PM  Result Value Ref Range   Sodium 135 135 - 145 mmol/L   Potassium 5.0 3.5 - 5.1 mmol/L   Chloride 104 101 - 111 mmol/L   CO2 22 22 - 32 mmol/L   Glucose, Bld 87 65 - 99 mg/dL   BUN 7 6 - 20 mg/dL   Creatinine, Ser 0.66 0.44 - 1.00 mg/dL   Calcium 9.3 8.9 - 10.3 mg/dL   GFR calc non Af Amer >60 >60 mL/min   GFR calc Af Amer >60 >60 mL/min    Comment: (NOTE) The eGFR has been calculated using the CKD EPI equation. This calculation has not been validated in all clinical situations. eGFR's persistently <60 mL/min signify possible Chronic Kidney Disease.    Anion gap 9 5 - 15  CBC with Differential/Platelet     Status: None   Collection Time: 03/03/16  1:19 PM  Result Value Ref Range   WBC 4.3 4.0 - 10.5 K/uL   RBC 4.46 3.87 - 5.11 MIL/uL   Hemoglobin 13.5 12.0 - 15.0 g/dL   HCT 39.9 36.0 - 46.0 %   MCV 89.5 78.0 - 100.0 fL   MCH 30.3 26.0 - 34.0 pg   MCHC 33.8 30.0 - 36.0 g/dL   RDW 13.2 11.5 - 15.5 %   Platelets 212 150 - 400 K/uL   Neutrophils Relative % 64 %   Neutro Abs 2.8 1.7 - 7.7 K/uL   Lymphocytes Relative 31 %   Lymphs Abs 1.3 0.7 - 4.0 K/uL   Monocytes Relative 5 %   Monocytes Absolute 0.2 0.1 - 1.0 K/uL   Eosinophils Relative 0 %   Eosinophils Absolute 0.0 0.0 - 0.7 K/uL   Basophils Relative 0 %   Basophils Absolute 0.0 0.0 - 0.1 K/uL  Magnesium     Status: None    Collection Time: 03/03/16  1:25 PM  Result Value Ref Range   Magnesium 2.0 1.7 - 2.4 mg/dL  Urine rapid drug screen (hosp performed)     Status: Abnormal   Collection Time: 03/03/16  2:41 PM  Result Value Ref Range   Opiates NONE DETECTED NONE DETECTED   Cocaine NONE DETECTED NONE DETECTED   Benzodiazepines NONE DETECTED NONE DETECTED   Amphetamines NONE DETECTED NONE DETECTED   Tetrahydrocannabinol POSITIVE (A) NONE DETECTED   Barbiturates NONE DETECTED NONE DETECTED    Comment:        DRUG SCREEN FOR MEDICAL PURPOSES ONLY.  IF CONFIRMATION IS NEEDED FOR ANY PURPOSE, NOTIFY LAB  WITHIN 5 DAYS.        LOWEST DETECTABLE LIMITS FOR URINE DRUG SCREEN Drug Class       Cutoff (ng/mL) Amphetamine      1000 Barbiturate      200 Benzodiazepine   440 Tricyclics       102 Opiates          300 Cocaine          300 THC              50   TSH     Status: None   Collection Time: 03/03/16  8:22 PM  Result Value Ref Range   TSH 0.736 0.350 - 4.500 uIU/mL     Lipid Panel  No results found for: CHOL, TRIG, HDL, CHOLHDL, VLDL, LDLCALC, LDLDIRECT   No results found for: HGBA1C   Lab Results  Component Value Date   CREATININE 0.66 03/03/2016     HPI :  20 y.o. female with a PMH of depression treated with Zoloft for the past 3 years who was in her usual state of health when she had the sudden onset of altered mental status. She was going through the drive through with friends, and when she attempted to look to the left to place her order, she could not move her gaze in that direction and subsequently felt her head turning to the right. She says she was unable to talk, and heard her friends tell the clerk at the drive through to call an ambulance. She came to when the ambulance arrived. She did not lose bladder or bowel function and there was no mouth trauma. She has never had a similar event, though she does report headaches for the last 1-2 years and occasional episodes of "tunnel  vision". None of her friends who witnessed the event were present at the bedside, but her mother says that they told her that Shandie said something "off the wall "and began rubbing her eye repetitively. There was no frank tonic-clonic convulsive activity. The patient denies any drug use other than occasional marijuana and occasional alcohol, but not daily use. She has not had any recent medication changes. She has not traveled recently, and has no recent insect bites or rashes on the skin. Family history was positive for a maternal grandmother having epilepsy, and a half-sister having been evaluated for similar vision issues with lapses of time, although no clear diagnosis of seizure was diagnosed.  ED Course: A CT of the head was obtained which was unremarkable. Neurological consultation was requested. No further events were reported.  HOSPITAL COURSE:   20 year old female with new onset seizure which is partial by history(initiated with head turning). Neurology recommended further workup with MRI and EEG. Neurology recommended not   starting antiepileptic therapy at this time, but certainly if there was evidence of for predisposition on MRI or EEG then I would favor starting seizure medicines.   1) MRI brain-of the brain is still pending if not completed as inpatient and patient would need an MRI in the next 2 weeks 2) EEG normal routine EEG of the awake and drowsy states 3) if she has further events, or if predisposition is demonstrated on either MRI or EEG then would consider starting Keppra. This was discussed with Dr Cristobal Goldmann   Discharge Exam:    Blood pressure 125/68, pulse 68, temperature 98.1 F (36.7 C), temperature source Oral, resp. rate 16, height '5\' 1"'  (1.549 m), weight 48.8 kg (107 lb 9.4 oz), last  menstrual period 02/28/2016, SpO2 100 %.   Chest: Lungs are clear to auscultation with good air movement. No rales, rhonchi or wheezes.  CV: Heart sounds are regular with an S1, S2. No  murmurs, rubs, clicks, or gallops.  Abdomen: Soft, nontender, nondistended with normal active bowel sounds. Extremities: Extremities are without clubbing, edema, or cyanosis. Pedal pulses 2+.  Skin: Warm and dry. No rashes, lesions or wounds. Tattoo right posterior shoulder. Neuro: Alert and oriented times 3; grossly nonfocal.  Psych: Insight is good and judgment is appropriate. Mood and affect normal.   Follow-up Information    Follow up with Primary care provider. Schedule an appointment as soon as possible for a visit in 3 days.   Why:   Hospital follow-up      Signed: Reyne Dumas 03/04/2016, 1:09 PM        Time spent >45 mins

## 2016-03-04 NOTE — Care Management Note (Signed)
Case Management Note  Patient Details  Name: Erica Duarte MRN: [id number] Date of Birth: 04-Jul-1996  Subjective/Objective:                    Action/Plan: Pt admitted with partial seizures. She is from home and plan is to discharge home. Pt is without a PCP. CM will provide her a [facility] list to assist her in finding a PCP.   Expected Discharge Date:                  Expected Discharge Plan:  Home/Self Care  In-House Referral:     Discharge planning Services     Post Acute Care Choice:    Choice offered to:     DME Arranged:    DME Agency:     HH Arranged:    [place] Agency:     Status of Service:  Completed, signed off  If discussed at [employer] of Stay Meetings, dates discussed:    Additional Comments:  Pollie Friar, RN 03/04/2016, 2:14 PM

## 2016-03-04 NOTE — Procedures (Signed)
ELECTROENCEPHALOGRAM REPORT  Date of Study: 03/04/2016  Patient's Name: Erica Duarte MRN: [id number] Date of Birth: 1996/06/14  Referring Provider: Etta Quill, PA-C  Indication: 20 y.o. female who is sitting down in the car when she had the sensation that she was looking to the right even though she was trying to look to the left. She then felt her head turned to the right. She hurt her friends say that someone need to call 911 missed last thing she remembers before being in the back of an ambulance. Witnesses report seizure activity. She currently has returned to baseline.  She denies any drug use other than occasional marijuana and occasional alcohol.  Medications: Sertraline Ondansetron Ketorolac Acetaminophen Norethindrone Acetate-Ethinyl Estradiol  Technical Summary: This is a multichannel digital EEG recording, using the international 10-20 placement system with electrodes applied with paste and impedances below 5000 ohms.    Description: The EEG background is symmetric, with a well-developed posterior dominant rhythm of 8 Hz, which is reactive to eye opening and closing.  Diffuse beta activity is seen, with a bilateral frontal preponderance.  No focal or generalized abnormalities are seen.  No focal or generalized epileptiform discharges are seen.  Stage II sleep is not seen.  Hyperventilation and photic stimulation were performed, and produced no abnormalities.  ECG revealed normal cardiac rate and rhythm.  Impression: This is a normal routine EEG of the awake and drowsy states, with activating procedures.  A normal study does not rule out the possibility of a seizure disorder in this patient.  Adam R. Tomi Likens, DO

## 2016-03-04 NOTE — Progress Notes (Signed)
EEG completed; results pending.    

## 2016-03-04 NOTE — Progress Notes (Signed)
Discharge instruction reviewed with patient/family. All questions answered at this time. Transport home by family.  Arjun Hard, RN 

## 2016-03-04 NOTE — Progress Notes (Signed)
Subjective: Today patient states her head never turned but she was unable to tourn her head. No further events.   Exam: Filed Vitals:   03/04/16 0033 03/04/16 0612  BP: 122/60 111/59  Pulse: 66 65  Temp: 98.7 F (37.1 C) 97.8 F (36.6 C)  Resp: 16 16        Gen: In bed, NAD MS: alert and oriented CN: 2-12 intact Motor: MAEW Sensory: intact throughout   Pertinent Labs/Diagnostics: EEG and MRI pending   Impression: 20 year old female with new onset seizure which is partial by history(initiated with head turning). I would favor further workup with MRI and EEG. I would not favor starting antiepileptic therapy at this time, but certainly if there was evidence of for predisposition on MRI or EEG then I would favor starting seizure medicines.  Recommendations: 1) MRI brain 2) EEG 3) if she has further events, or if predisposition is demonstrated on either MRI or EEG then would consider starting Keppra.    Etta Quill PA-C [facility] [phone]   03/04/2016, 9:53 AM

## 2016-03-05 ENCOUNTER — Ambulatory Visit (INDEPENDENT_AMBULATORY_CARE_PROVIDER_SITE_OTHER): Payer: 59 | Admitting: Emergency Medicine

## 2016-03-05 VITALS — BP 108/64 | HR 75 | Temp 98.1°F | Resp 16 | Ht 61.0 in | Wt 108.8 lb

## 2016-03-05 DIAGNOSIS — R569 Unspecified convulsions: Secondary | ICD-10-CM | POA: Diagnosis not present

## 2016-03-05 NOTE — Patient Instructions (Addendum)
     IF you received an x-ray today, you will receive an invoice from [facility]. Please contact [facility] at [phone] with questions or concerns regarding your invoice.   IF you received labwork today, you will receive an invoice from [employer]. Please contact [facility] at [phone] with questions or concerns regarding your invoice.   Our billing staff will not be able to assist you with questions regarding bills from these companies.  You will be contacted with the lab results as soon as they are available. The fastest way to get your results is to activate your My Chart account. Instructions are located on the last page of this paperwork. If you have not heard from [place] regarding the results in 2 weeks, please contact this office.     Seizure, Adult A seizure means there is unusual activity in the brain. A seizure can cause changes in attention or behavior. Seizures often cause shaking (convulsions). Seizures often last from 30 seconds to 2 minutes. HOME CARE   If you are given medicines, take them exactly as told by your doctor.  Keep all doctor visits as told.  Do not swim or drive until your doctor says it is okay.  Teach others what to do if you have a seizure. They should:  Lay you on the ground.  Put a cushion under your head.  Loosen any tight clothing around your neck.  Turn you on your side.  Stay with you until you get better. GET HELP RIGHT AWAY IF:   The seizure lasts longer than 2 to 5 minutes.  The seizure is very bad.  The person does not wake up after the seizure.  The person's attention or behavior changes. Drive the person to the emergency room or call your local emergency services (911 in [place] MAKE SURE YOU:   Understand these instructions.  Will watch your condition.  Will get help right away if you are not doing well or get worse.   This information is not intended to replace advice  given to you by your health care provider. Make sure you discuss any questions you have with your health care provider.   Document Released: 02/12/2008 Document Revised: 11/18/2011 Document Reviewed: 04/07/2013 Elsevier Interactive Patient Education [employer].

## 2016-03-05 NOTE — Progress Notes (Signed)
By signing my name below, I, Moises Blood, attest that this documentation has been prepared under the direction and in the presence of Arlyss Queen, MD. Electronically Signed: Moises Blood, [place]. 03/05/2016 , 1:38 PM .  Patient was seen in room 5 .  Chief Complaint:  Chief Complaint  Patient presents with  . Follow-up    had a seizure was hospitalized     HPI: Erica Duarte is a 20 y.o. female who reports to [facility] today for hospital follow up. She was admitted into ED 2 days ago for sudden onset of altered mental status. Patient states she was in the drive through at [place]. She was driving and looked left towards the window to pick up the food, but her neck kept turning to the right. Her vision started getting blurry and her friend in passenger seat asked if she was okay. The patient tried to respond but couldn't speak. Eventually, her vision started to blur and vision went black. She left the hospital yesterday evening. A referral was made to neurologist. She also reports needing a referral for MRI, as she wasn't able to have one done before she was discharged. She mentions not eating that morning of seizure.   Patient reports that she hasn't been able to sleep enough lately. She is currently on birth control and denies possible chance of pregnancy. She denies much alcohol use. She denies illicit drug use. She denies smoking.   She is currently on summer break; studying nutrition at [employer]. 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   Past Medical History  Diagnosis Date  . Menorrhagia   . Depression   . Seizures [facility])    Past Surgical History  Procedure Laterality Date  . Wisdom tooth extraction     Social History   Social History  . Marital Status: Single    Spouse Name: N/A  . Number of Children: 0  . Years of Education: N/A   Occupational History  . Student    Social History Main Topics  . Smoking status: Never Smoker   . Smokeless tobacco: Never Used  .  Alcohol Use: 0.0 oz/week    0 Standard drinks or equivalent per week     Comment: Occasional social use.  . Drug Use: Yes    Special: Marijuana     Comment: Marijuana  . Sexual Activity: Yes    Birth Control/ Protection: Condom, OCP   Other Topics Concern  . 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 She is single.   Family History  Problem Relation Age of Onset  . Hypertension Mother   . Thyroid disease Mother     hypo  . Hyperlipidemia Mother   . Heart attack Maternal Aunt   . Thyroid disease Maternal Aunt     hyper  . Breast cancer Maternal Grandmother 60  . Depression Maternal Grandmother   . Epilepsy Maternal Grandmother   . Heart disease Maternal Grandfather    No Known Allergies Prior to Admission medications   Medication Sig Start Date End Date Taking? Authorizing Provider  Norethindrone Acetate-Ethinyl Estradiol (MICROGESTIN) 1.5-30 MG-MCG tablet Take 1 tablet by mouth daily. 07/03/15  Yes Kem Boroughs, FNP  sertraline (ZOLOFT) 25 MG tablet Take 1 tablet by mouth  daily 02/27/16  Yes Kem Boroughs, FNP  ROS:  Constitutional: negative for fever, chills, night sweats, weight changes, or fatigue  HEENT: negative for vision changes, hearing loss, congestion, rhinorrhea, ST, epistaxis, or sinus pressure Cardiovascular: negative for chest pain or palpitations Respiratory: negative for hemoptysis, wheezing, shortness of breath, or cough Abdominal: negative for abdominal pain, nausea, vomiting, diarrhea, or constipation Dermatological: negative for rash Neurologic: negative for headache, dizziness, or syncope All other systems reviewed and are otherwise negative with the exception to those above and in the HPI.  PHYSICAL EXAM: Filed Vitals:   03/05/16 1317  BP: 108/64  Pulse: 75  Temp: 98.1 F (36.7 C)  Resp: 16   Body mass index is 20.57  kg/(m^2).   General: Alert, no acute distress HEENT:  Normocephalic, atraumatic, oropharynx patent. Eye: Juliette Mangle [facility] Cardiovascular:  Regular rate and rhythm, no rubs murmurs or gallops.  No Carotid bruits, radial pulse intact. No pedal edema.  Respiratory: Clear to auscultation bilaterally.  No wheezes, rales, or rhonchi.  No cyanosis, no use of accessory musculature Abdominal: No organomegaly, abdomen is soft and non-tender, positive bowel sounds. No masses. Musculoskeletal: Gait intact. No edema, tenderness Skin: No rashes. Neurologic: Facial musculature symmetric. Psychiatric: Patient acts appropriately throughout our interaction.  Lymphatic: No cervical or submandibular lymphadenopathy Genitourinary/Anorectal: No acute findings  LABS:   EKG/XRAY:   norml sinus rhythm.    ASSESSMENT/PLAN:  this appears to be new onset partial seizure. Drug screen was positive for marijuana but no other drugs. Will arrange outpatient MRI as well as neural referral. She was advised she cannot drive and cannot swim. She was advised to get a medic alert bracelet.  I did go ahead and make cardiology referral also.I personally performed the services described in this documentation, which was scribed in my presence. The recorded information has been reviewed and is accurate.  Gross sideeffects, risk and benefits, and alternatives of medications d/w patient. Patient is aware that all medications have potential sideeffects and we are unable to predict every sideeffect or drug-drug interaction that may occur.  Arlyss Queen MD 03/05/2016 1:27 PM

## 2016-03-10 ENCOUNTER — Ambulatory Visit
Admission: RE | Admit: 2016-03-10 | Discharge: 2016-03-10 | Disposition: A | Discharge status: Home / Self Care | Payer: 59 | Source: Ambulatory Visit | Attending: Emergency Medicine | Admitting: Emergency Medicine

## 2016-03-10 DIAGNOSIS — R569 Unspecified convulsions: Secondary | ICD-10-CM

## 2016-03-15 ENCOUNTER — Other Ambulatory Visit: Payer: Self-pay | Admitting: Neurology

## 2016-03-15 ENCOUNTER — Ambulatory Visit (INDEPENDENT_AMBULATORY_CARE_PROVIDER_SITE_OTHER): Payer: 59 | Admitting: Neurology

## 2016-03-15 ENCOUNTER — Encounter: Payer: Self-pay | Admitting: Neurology

## 2016-03-15 VITALS — BP 112/64 | HR 80 | Ht 61.0 in | Wt 114.0 lb

## 2016-03-15 DIAGNOSIS — R569 Unspecified convulsions: Secondary | ICD-10-CM | POA: Diagnosis not present

## 2016-03-15 DIAGNOSIS — G43109 Migraine with aura, not intractable, without status migrainosus: Secondary | ICD-10-CM

## 2016-03-15 DIAGNOSIS — G43909 Migraine, unspecified, not intractable, without status migrainosus: Secondary | ICD-10-CM | POA: Diagnosis not present

## 2016-03-15 MED ORDER — RIZATRIPTAN BENZOATE 10 MG PO TABS: 10.0000 mg | ORAL_TABLET | Freq: Three times a day (TID) | ORAL | Status: DC | PRN

## 2016-03-15 MED ORDER — RIZATRIPTAN BENZOATE 10 MG PO TBDP: 10.0000 mg | ORAL_TABLET | Freq: Three times a day (TID) | ORAL | Status: DC | PRN

## 2016-03-15 NOTE — Progress Notes (Signed)
Reason for visit: Possible seizure  Referring physician: Dr. Sheryle Hail is a 20 y.o. female  History of present illness:  Ms. Flash is a 20 year old right-handed white female with a history of frequent migraine headaches. The patient has about 15 or 18 days of the month with headache. The headaches are generally bitemporal in nature but sometimes can be generalized. The patient usually does not have much nausea or vomiting, the severe headaches may be associated with photophobia and phonophobia. In the past, the patient has had occasional episodes of dizziness and tunnel vision. She has had at least one episode of syncope. The patient was admitted to the hospital on 03/03/2016 with a seizure-type event. The episode occurred when she was driving a car, she was at a drive through at [employer]. The patient turned her head to the left to pick up the food, but then suddenly noted that her head was being forced to the right, she tried to turn her head back, but the head kept going back to the right. The patient then loss consciousness, her friends told her that she had some jerking of the head and shoulders. The patient was transported to the hospital by EMS. A workup included MRI of the brain that shows minimal punctate white matter lesions in the frontal lobes, EEG study was normal. The patient was not placed on anticonvulsant medications, she was discharged to home. The patient has not had any recurrence of these events. The patient denies any focal numbness or weakness of the face, arms, or legs. She denies any balance changes. She did not bite her tongue or lose control the bowels or the bladder with the event. She is sent to this office for further evaluation.   Past Medical History  Diagnosis Date  . Menorrhagia   . Depression   . Seizures [facility])     Past Surgical History  Procedure Laterality Date  . Wisdom tooth extraction      Family History  Problem Relation Age of Onset    . Hypertension Mother   . Thyroid disease Mother     hypo  . Hyperlipidemia Mother   . Heart attack Maternal Aunt   . Thyroid disease Maternal Aunt     hyper  . Breast cancer Maternal Grandmother 60  . Depression Maternal Grandmother   . Epilepsy Maternal Grandmother   . Heart disease Maternal Grandfather     Social history:  reports that she has never smoked. She has never used smokeless tobacco. She reports that she drinks alcohol. She reports that she uses illicit drugs (Marijuana).  Medications:  Prior to Admission medications   Medication Sig Start Date End Date Taking? Authorizing Provider  Norethindrone Acetate-Ethinyl Estradiol (MICROGESTIN) 1.5-30 MG-MCG tablet Take 1 tablet by mouth daily. 07/03/15  Yes Kem Boroughs, FNP  Omega-3 Fatty Acids (FISH OIL PO) Take by mouth daily.   Yes Historical Provider, MD  sertraline (ZOLOFT) 25 MG tablet Take 1 tablet by mouth  daily 02/27/16  Yes Kem Boroughs, FNP     No Known Allergies  ROS:  Out of a complete 14 system review of symptoms, the patient complains only of the following symptoms, and all other reviewed systems are negative.  Blurred vision Memory loss, confusion, headache, slurred speech, seizure   Blood pressure 112/64, pulse 80, height 5\' 1"  (1.549 m), weight 114 lb (51.71 kg), last menstrual period 02/28/2016.  Physical Exam  General: The patient is alert and cooperative at the time  of the examination.  Eyes: Pupils are equal, round, and reactive to light. Discs are flat bilaterally.  Neck: The neck is supple, no carotid bruits are noted.  Respiratory: The respiratory examination is clear.  Cardiovascular: The cardiovascular examination reveals a regular rate and rhythm, no obvious murmurs or rubs are noted.  Skin: Extremities are without significant edema.  Neurologic Exam  Mental status: The patient is alert and oriented x 3 at the time of the examination. The patient has apparent normal recent  and remote memory, with an apparently normal attention span and concentration ability.  Cranial nerves: Facial symmetry is present. There is good sensation of the face to pinprick and soft touch bilaterally. The strength of the facial muscles and the muscles to head turning and shoulder shrug are normal bilaterally. Speech is well enunciated, no aphasia or dysarthria is noted. Extraocular movements are full. Visual fields are full. The tongue is midline, and the patient has symmetric elevation of the soft palate. No obvious hearing deficits are noted.  Motor: The motor testing reveals 5 over 5 strength of all 4 extremities. Good symmetric motor tone is noted throughout.  Sensory: Sensory testing is intact to pinprick, soft touch, vibration sensation, and position sense on all 4 extremities. No evidence of extinction is noted.  Coordination: Cerebellar testing reveals good finger-nose-finger and heel-to-shin bilaterally.  Gait and station: Gait is normal. Tandem gait is normal. Romberg is negative. No drift is seen.  Reflexes: Deep tendon reflexes are symmetric and normal bilaterally. Toes are downgoing bilaterally.   MRI brain 03/10/16:  IMPRESSION: Scattered (less than 10) foci of abnormal signal in the frontal white matter abnormal for age. Complicated migraine is favored.  No intracranial mass lesion or structural abnormality which might suggest a cause of recent seizures.  * MRI scan images were reviewed online. I agree with the written report.    Assessment/Plan:   1. 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 Intractable migraine headache  The patient has had an event that would be consistent with a seizure. The patient has had a relatively unremarkable workup with MRI of the brain and an EEG study. Urine drug screen was positive for THC, otherwise negative. The patient will be watched on a conservative basis at this time, she is not to drive for 6 months. If  another event occurs, she is to contact our office. We have discussed going on a medication for her migraine, the patient does not wish to go on a daily drug, she will be given Maxalt to take if needed for her headache. The maternal grandmother has seizures, otherwise the family history is unremarkable.  Jill Alexanders MD 03/15/2016 2:58 PM  [facility] [address] [place], [place] [address]  Phone [phone] Fax [phone]

## 2016-03-15 NOTE — Telephone Encounter (Signed)
Erica Duarte/[facility] called on behalf of pt to see if rizatriptan (MAXALT-MLT) 10 MG disintegrating tablet could be changed to the regular release. The written rx is not covered under pts insurance and is to expensive. Please call [phone] ask for Janett Billow.

## 2016-03-19 ENCOUNTER — Encounter: Payer: Self-pay | Admitting: Nurse Practitioner

## 2016-03-22 ENCOUNTER — Telehealth: Payer: Self-pay | Admitting: Nurse Practitioner

## 2016-03-22 NOTE — Telephone Encounter (Signed)
Spoke with patient. Advised I have spoken with Kem Boroughs, FNP. Kem Boroughs, FNP contacted the patient earlier today to discuss a letter from [place] regarding her taking Maxalt and Zoloft. Per notification medication should usually be avoided together. Kem Boroughs, FNP has reached out to Dr.Willis the patient's neurologist to discuss medications and if any change is needed. We are waiting to hear back from Dr.Willis at this time. Patient reports she has not had to take the Maxalt thus far and is only taking Zoloft daily. Advised if she needs to take Maxalt not to take Zoloft at the same time. Will need to stagger doses per Kem Boroughs, FNP. Patient is agreeable. She is aware once we speak with Dr.Willis we will be in contact with her regarding further recommendations. She is agreeable.  Kem Boroughs, FNP do you agree with recommendations?

## 2016-03-22 NOTE — Telephone Encounter (Signed)
Spoke with patient. Patient reports she had a seizure a few weeks ago and is being followed by Neurology. States seizure came from a "complex migraine." She has been started on Maxalt 10 mg which she is to take when she feels a migraine coming on. Reports her neurologist told her medications could be contributing to her symptoms. Patient states the neurologist did not mention a specific medication, but that she wanted to let Kem Boroughs, FNP know to see if she needs different medications. She is currently taking Microgestin and Zoloft 25 mg. Denies any complications since being seen with neurology. Advised I will speak with Kem Boroughs, FNP and return call with further recommendations. She is agreeable.

## 2016-03-22 NOTE — Telephone Encounter (Signed)
Patient calling to speak with the nurse regarding a medication her neurologist says could be causing her headaches.

## 2016-03-22 NOTE — Telephone Encounter (Signed)
Pt was called earlier today to inform her that we have sent a letter to Neurologist.  The insurance sent [place] a report saying the Zoloft and Maxalt should not be given together.  We are awaiting a response back and may need to stop the Maxalt or Zoloft.

## 2016-03-22 NOTE — Telephone Encounter (Signed)
Yes. agree

## 2016-03-25 ENCOUNTER — Other Ambulatory Visit: Payer: Self-pay | Admitting: Nurse Practitioner

## 2016-03-26 NOTE — Telephone Encounter (Signed)
Medication refill request: Junel Last AEX:  03/24/15 PG Next AEX: 04/01/16 Last MMG (if hormonal medication request): n/a Refill authorized: 07/03/15 #90 2R. Please advise. Thank you.

## 2016-04-01 ENCOUNTER — Encounter: Payer: Self-pay | Admitting: Nurse Practitioner

## 2016-04-01 ENCOUNTER — Ambulatory Visit (INDEPENDENT_AMBULATORY_CARE_PROVIDER_SITE_OTHER): Payer: 59 | Admitting: Nurse Practitioner

## 2016-04-01 VITALS — BP 110/66 | HR 64 | Ht 62.0 in | Wt 111.0 lb

## 2016-04-01 DIAGNOSIS — Z113 Encounter for screening for infections with a predominantly sexual mode of transmission: Secondary | ICD-10-CM

## 2016-04-01 DIAGNOSIS — Z01419 Encounter for gynecological examination (general) (routine) without abnormal findings: Secondary | ICD-10-CM

## 2016-04-01 DIAGNOSIS — Z658 Other specified problems related to psychosocial circumstances: Secondary | ICD-10-CM

## 2016-04-01 DIAGNOSIS — Z0000 Encounter for general adult medical examination without abnormal findings: Secondary | ICD-10-CM | POA: Diagnosis not present

## 2016-04-01 DIAGNOSIS — F439 Reaction to severe stress, unspecified: Secondary | ICD-10-CM

## 2016-04-01 LAB — POCT URINALYSIS DIPSTICK
BILIRUBIN UA: NEGATIVE
GLUCOSE UA: NEGATIVE
Ketones, UA: NEGATIVE
NITRITE UA: NEGATIVE
PH UA: 6
Protein, UA: NEGATIVE
UROBILINOGEN UA: NEGATIVE

## 2016-04-01 MED ORDER — SERTRALINE HCL 25 MG PO TABS: 25.0000 mg | ORAL_TABLET | Freq: Every day | ORAL | 4 refills | Status: DC

## 2016-04-01 MED ORDER — NORETHINDRONE ACET-ETHINYL EST 1.5-30 MG-MCG PO TABS: 1.0000 | ORAL_TABLET | Freq: Every day | ORAL | 4 refills | Status: DC

## 2016-04-01 NOTE — Progress Notes (Signed)
Patient ID: Erica Duarte, female   DOB: Jan 21, 1996, 20 y.o.   MRN: [id number]  19 y.o. G0P0000 Single  Caucasian Fe here for annual exam.  Seizure last month-new dx. [facility] sent a letter about taking Zoloft and Maxalt together - have sent a letter to Neuro and no response yet.   She has not filled the RX for Maxalt yet.   MRI was negative.  Menses last 4-6 days.  Flow moderate to light.  Some cramps.  Not dating, occasional SA with previous partner. 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  Still feels she needs the Zoloft to help her with school.    She will be going to [facility] in 2 weeks to meet a pen pal that she has been corresponding with since the 3 rd grade.  Has never met him before.  Patient's last menstrual period was 02/28/2016.          Sexually active: Yes.    The current method of family planning is OCP (estrogen/progesterone) and condoms all the time.    Exercising: No.  The patient does not participate in regular exercise at present. Smoker:  no  Health Maintenance: Pap: None per guidelines TDaP: 03/09/13 Gardasil: Never, declines at this time HIV: 11/09/13 Labs: HB: 14.9   Urine: Negative    reports that she has never smoked. She has never used smokeless tobacco. She reports that she drinks alcohol. She reports that she uses drugs, including Marijuana.  Past Medical History:  Diagnosis Date  . Depression   . Menorrhagia   . Seizures ([place])     Past Surgical History:  Procedure Laterality Date  . WISDOM TOOTH EXTRACTION      Current Outpatient Prescriptions  Medication Sig Dispense Refill  . JUNEL 1.5/30 1.5-30 MG-MCG tablet Take 1 tablet by mouth  daily as directed 3 Package 0  . Omega-3 Fatty Acids (FISH OIL PO) Take by mouth daily.    . rizatriptan (MAXALT) 10 MG tablet Take 1 tablet (10 mg total) by mouth 3 (three) times daily as needed for migraine. 9 tablet 3  . rizatriptan (MAXALT-MLT) 10 MG disintegrating tablet Take 1 tablet (10 mg total) by mouth 3 (three)  times daily as needed for migraine. 9 tablet 3  . sertraline (ZOLOFT) 25 MG tablet Take 1 tablet by mouth  daily 90 tablet 0   No current facility-administered medications for this visit.     Family History  Problem Relation Age of Onset  . Hypertension Mother   . Thyroid disease Mother     hypo  . Hyperlipidemia Mother   . Heart attack Maternal Aunt   . Thyroid disease Maternal Aunt     hyper  . Breast cancer Maternal Grandmother 60  . Depression Maternal Grandmother   . Epilepsy Maternal Grandmother   . Heart disease Maternal Grandfather     ROS:  Pertinent items are noted in HPI.  Otherwise, a comprehensive ROS was negative.  Exam:   LMP 02/28/2016    Ht Readings from Last 3 Encounters:  03/15/16 '5\' 1"'  (1.549 m)  03/05/16 '5\' 1"'  (1.549 m)  03/03/16 '5\' 1"'  (1.549 m)    General appearance: alert, cooperative and appears stated age Head: Normocephalic, without obvious abnormality, atraumatic Neck: no adenopathy, supple, symmetrical, trachea midline and thyroid normal to inspection and palpation Lungs: clear to auscultation bilaterally Breasts: normal appearance, no masses or tenderness Heart: regular rate and rhythm Abdomen: soft, non-tender; no masses,  no organomegaly Extremities: extremities normal, atraumatic,  no cyanosis or edema Skin: Skin color, texture, turgor normal. No rashes or lesions Lymph nodes: Cervical, supraclavicular, and axillary nodes normal. No abnormal inguinal nodes palpated Neurologic: Grossly normal   Pelvic: External genitalia:  no lesions              Urethra:  normal appearing urethra with no masses, tenderness or lesions              Bartholin's and Skene's: normal                 Vagina: normal appearing vagina with normal color and discharge, no lesions              Cervix: anteverted              Pap taken: No. Bimanual Exam:  Uterus:  normal size, contour, position, consistency, mobility, non-tender              Adnexa: no mass,  fullness, tenderness               Rectovaginal: Confirms               Anus:  normal sphincter tone, no lesions  Chaperone present: yes  A:  Well Woman with normal exam  Contraception with OCP  History of situational stressors and depression  New diagnosis of seizure - possible related to migraine 03/05/16 with negative MRI  R/O STD's  P:   Reviewed health and wellness pertinent to exam  Pap smear as above  Refill on Junel 1.5/30 for a year  Refill on Zoloft 25 mg for a year  Follow with labs  Counseled on breast self exam, STD prevention, HIV risk factors and prevention, use and side effects of OCP's, adequate intake of calcium and vitamin D, diet and exercise return annually or prn  An After Visit Summary was printed and given to the patient.

## 2016-04-01 NOTE — Patient Instructions (Signed)
General topics  Next pap or exam is  due in 1 year Take a Women's multivitamin Take 1200 mg. of calcium daily - prefer dietary If any concerns in interim to call back  Breast Self-Awareness Practicing breast self-awareness may pick up problems early, prevent significant medical complications, and possibly save your life. By practicing breast self-awareness, you can become familiar with how your breasts look and feel and if your breasts are changing. This allows you to notice changes early. It can also offer you some reassurance that your breast health is good. One way to learn what is normal for your breasts and whether your breasts are changing is to do a breast self-exam. If you find a lump or something that was not present in the past, it is best to contact your caregiver right away. Other findings that should be evaluated by your caregiver include nipple discharge, especially if it is bloody; skin changes or reddening; areas where the skin seems to be pulled in (retracted); or new lumps and bumps. Breast pain is seldom associated with cancer (malignancy), but should also be evaluated by a caregiver. BREAST SELF-EXAM The best time to examine your breasts is 5 7 days after your menstrual period is over.  ExitCare Patient Information 2013 ExitCare[employer].   Exercise to Stay Healthy Exercise helps you become and stay healthy. EXERCISE IDEAS AND TIPS Choose exercises that:  You enjoy.  Fit into your day. You do not need to exercise really hard to be healthy. You can do exercises at a slow or medium level and stay healthy. You can:  Stretch before and after working out.  Try yoga, Pilates, or tai chi.  Lift weights.  Walk fast, swim, jog, run, climb stairs, bicycle, dance, or rollerskate.  Take aerobic classes. Exercises that burn about 150 calories:  Running 1  miles in 15 minutes.  Playing volleyball for 45 to 60 minutes.  Washing and waxing a car for 45 to 60  minutes.  Playing touch football for 45 minutes.  Walking 1  miles in 35 minutes.  Pushing a stroller 1  miles in 30 minutes.  Playing basketball for 30 minutes.  Raking leaves for 30 minutes.  Bicycling 5 miles in 30 minutes.  Walking 2 miles in 30 minutes.  Dancing for 30 minutes.  Shoveling snow for 15 minutes.  Swimming laps for 20 minutes.  Walking up stairs for 15 minutes.  Bicycling 4 miles in 15 minutes.  Gardening for 30 to 45 minutes.  Jumping rope for 15 minutes.  Washing windows or floors for 45 to 60 minutes. Document Released: 09/28/2010 Document Revised: 11/18/2011 Document Reviewed: 09/28/2010 ExitCare Patient Information 2013 ExitCare[employer].   Other topics ( that may be useful information):    Sexually Transmitted Disease Sexually transmitted disease (STD) refers to any infection that is passed from person to person during sexual activity. This may happen by way of saliva, semen, blood, vaginal mucus, or urine. Common STDs include:  Gonorrhea.  Chlamydia.  Syphilis.  HIV/AIDS.  Genital herpes.  Hepatitis B and C.  Trichomonas.  Human papillomavirus (HPV).  Pubic lice. CAUSES  An STD may be spread by bacteria, virus, or parasite. A person can get an STD by:  Sexual intercourse with an infected person.  Sharing sex toys with an infected person.  Sharing needles with an infected person.  Having intimate contact with the genitals, mouth, or rectal areas of an infected person. SYMPTOMS  Some people may not have any symptoms, but   they can still pass the infection to others. Different STDs have different symptoms. Symptoms include:  Painful or bloody urination.  Pain in the pelvis, abdomen, vagina, anus, throat, or eyes.  Skin rash, itching, irritation, growths, or sores (lesions). These usually occur in the genital or anal area.  Abnormal vaginal discharge.  Penile discharge in men.  Soft, flesh-colored skin growths in the  genital or anal area.  Fever.  Pain or bleeding during sexual intercourse.  Swollen glands in the groin area.  Yellow skin and eyes (jaundice). This is seen with hepatitis. DIAGNOSIS  To make a diagnosis, your caregiver may:  Take a medical history.  Perform a physical exam.  Take a specimen (culture) to be examined.  Examine a sample of discharge under a microscope.  Perform blood test TREATMENT   Chlamydia, gonorrhea, trichomonas, and syphilis can be cured with antibiotic medicine.  Genital herpes, hepatitis, and HIV can be treated, but not cured, with prescribed medicines. The medicines will lessen the symptoms.  Genital warts from HPV can be treated with medicine or by freezing, burning (electrocautery), or surgery. Warts may come back.  HPV is a virus and cannot be cured with medicine or surgery.However, abnormal areas may be followed very closely by your caregiver and may be removed from the cervix, vagina, or vulva through office procedures or surgery. If your diagnosis is confirmed, your recent sexual partners need treatment. This is true even if they are symptom-free or have a negative culture or evaluation. They should not have sex until their caregiver says it is okay. HOME CARE INSTRUCTIONS  All sexual partners should be informed, tested, and treated for all STDs.  Take your antibiotics as directed. Finish them even if you start to feel better.  Only take over-the-counter or prescription medicines for pain, discomfort, or fever as directed by your caregiver.  Rest.  Eat a balanced diet and drink enough fluids to keep your urine clear or pale yellow.  Do not have sex until treatment is completed and you have followed up with your caregiver. STDs should be checked after treatment.  Keep all follow-up appointments, Pap tests, and blood tests as directed by your caregiver.  Only use latex condoms and water-soluble lubricants during sexual activity. Do not use  petroleum jelly or oils.  Avoid alcohol and illegal drugs.  Get vaccinated for HPV and hepatitis. If you have not received these vaccines in the past, talk to your caregiver about whether one or both might be right for you.  Avoid risky sex practices that can break the skin. The only way to avoid getting an STD is to avoid all sexual activity.Latex condoms and dental dams (for oral sex) will help lessen the risk of getting an STD, but will not completely eliminate the risk. SEEK MEDICAL CARE IF:   You have a fever.  You have any new or worsening symptoms. Document Released: 11/16/2002 Document Revised: 11/18/2011 Document Reviewed: 11/23/2010 [facility] Patient Information 2013 [employer].    Domestic Abuse You are being battered or abused if someone close to you hits, pushes, or physically hurts you in any way. You also are being abused if you are forced into activities. You are being sexually abused if you are forced to have sexual contact of any kind. You are being emotionally abused if you are made to feel worthless or if you are constantly threatened. It is important to remember that help is available. No one has the right to abuse you. PREVENTION OF FURTHER  ABUSE  Learn the warning signs of danger. This varies with situations but may include: the use of alcohol, threats, isolation from friends and family, or forced sexual contact. Leave if you feel that violence is going to occur.  If you are attacked or beaten, report it to the police so the abuse is documented. You do not have to press charges. The police can protect you while you or the attackers are leaving. Get the officer's name and badge number and a copy of the report.  Find someone you can trust and tell them what is happening to you: your caregiver, a nurse, clergy member, close friend or family member. Feeling ashamed is natural, but remember that you have done nothing wrong. No one deserves abuse. Document Released:  08/23/2000 Document Revised: 11/18/2011 Document Reviewed: 11/01/2010 ExitCare Patient Information 2013 [employer]Care[employer].    How Much is Too Much Alcohol? Drinking too much alcohol can cause injury, accidents, and health problems. These types of problems can include:   Car crashes.  Falls.  Family fighting (domestic violence).  Drowning.  Fights.  Injuries.  Burns.  Damage to certain organs.  Having a baby with birth defects. ONE DRINK CAN BE TOO MUCH WHEN YOU ARE:  Working.  Pregnant or breastfeeding.  Taking medicines. Ask your doctor.  Driving or planning to drive. If you or someone you know has a drinking problem, get help from a doctor.  Document Released: 06/22/2009 Document Revised: 11/18/2011 Document Reviewed: 06/22/2009 ExitCare Patient Information 2013 [employer]Care[employer].   Smoking Hazards Smoking cigarettes is extremely bad for your health. Tobacco smoke has over 200 known poisons in it. There are over 60 chemicals in tobacco smoke that cause cancer. Some of the chemicals found in cigarette smoke include:   Cyanide.  Benzene.  Formaldehyde.  Methanol (wood alcohol).  Acetylene (fuel used in welding torches).  Ammonia. Cigarette smoke also contains the poisonous gases nitrogen oxide and carbon monoxide.  Cigarette smokers have an increased risk of many serious medical problems and Smoking causes approximately:  90% of all lung cancer deaths in men.  80% of all lung cancer deaths in women.  90% of deaths from chronic obstructive lung disease. Compared with nonsmokers, smoking increases the risk of:  Coronary heart disease by 2 to 4 times.  Stroke by 2 to 4 times.  Men developing lung cancer by 23 times.  Women developing lung cancer by 13 times.  Dying from chronic obstructive lung diseases by 12 times.  . Smoking is the most preventable cause of death and disease in our society.  WHY IS SMOKING ADDICTIVE?  Nicotine is the chemical  agent in tobacco that is capable of causing addiction or dependence.  When you smoke and inhale, nicotine is absorbed rapidly into the bloodstream through your lungs. Nicotine absorbed through the lungs is capable of creating a powerful addiction. Both inhaled and non-inhaled nicotine may be addictive.  Addiction studies of cigarettes and spit tobacco show that addiction to nicotine occurs mainly during the teen years, when young people begin using tobacco products. WHAT ARE THE BENEFITS OF QUITTING?  There are many health benefits to quitting smoking.   Likelihood of developing cancer and heart disease decreases. Health improvements are seen almost immediately.  Blood pressure, pulse rate, and breathing patterns start returning to normal soon after quitting. QUITTING SMOKING   American Lung Association - 1-800-LUNGUSA  American Cancer Society - 1-800-ACS-2345 Document Released: 10/03/2004 Document Revised: 11/18/2011 Document Reviewed: 06/07/2009 ExitCare Patient Information 2013 ExitCare,   LLC.   Stress Management Stress is a state of physical or mental tension that often results from changes in your life or normal routine. Some common causes of stress are:  Death of a loved one.  Injuries or severe illnesses.  Getting fired or changing jobs.  Moving into a new home. Other causes may be:  Sexual problems.  Business or financial losses.  Taking on a large debt.  Regular conflict with someone at home or at work.  Constant tiredness from lack of sleep. It is not just bad things that are stressful. It may be stressful to:  Win the lottery.  Get married.  Buy a new car. The amount of stress that can be easily tolerated varies from person to person. Changes generally cause stress, regardless of the types of change. Too much stress can affect your health. It may lead to physical or emotional problems. Too little stress (boredom) may also become stressful. SUGGESTIONS TO  REDUCE STRESS:  Talk things over with your family and friends. It often is helpful to share your concerns and worries. If you feel your problem is serious, you may want to get help from a professional counselor.  Consider your problems one at a time instead of lumping them all together. Trying to take care of everything at once may seem impossible. List all the things you need to do and then start with the most important one. Set a goal to accomplish 2 or 3 things each day. If you expect to do too many in a single day you will naturally fail, causing you to feel even more stressed.  Do not use alcohol or drugs to relieve stress. Although you may feel better for a short time, they do not remove the problems that caused the stress. They can also be habit forming.  Exercise regularly - at least 3 times per week. Physical exercise can help to relieve that "uptight" feeling and will relax you.  The shortest distance between despair and hope is often a good night's sleep.  Go to bed and get up on time allowing yourself time for appointments without being rushed.  Take a short "time-out" period from any stressful situation that occurs during the day. Close your eyes and take some deep breaths. Starting with the muscles in your face, tense them, hold it for a few seconds, then relax. Repeat this with the muscles in your neck, shoulders, hand, stomach, back and legs.  Take good care of yourself. Eat a balanced diet and get plenty of rest.  Schedule time for having fun. Take a break from your daily routine to relax. HOME CARE INSTRUCTIONS   Call if you feel overwhelmed by your problems and feel you can no longer manage them on your own.  Return immediately if you feel like hurting yourself or someone else. Document Released: 02/19/2001 Document Revised: 11/18/2011 Document Reviewed: 10/12/2007 ExitCare Patient Information 2013 ExitCare, LLC.  

## 2016-04-02 LAB — STD PANEL
HEP B S AG: NEGATIVE
HIV: NONREACTIVE

## 2016-04-02 LAB — HEMOGLOBIN, FINGERSTICK: Hemoglobin, fingerstick: 14.9 g/dL (ref 12.0–16.0)

## 2016-04-03 LAB — IPS N GONORRHOEA AND CHLAMYDIA BY PCR

## 2016-04-04 NOTE — Progress Notes (Signed)
Reviewed personally.  M. Suzanne Rickey Sadowski, MD.  

## 2016-04-05 NOTE — Progress Notes (Signed)
Will change contraceptive method to progesterone only.  Reviewed personally.  Felipa Emory, MD.

## 2016-04-08 ENCOUNTER — Telehealth: Payer: Self-pay | Admitting: Nurse Practitioner

## 2016-04-08 MED ORDER — NORETHINDRONE 0.35 MG PO TABS: 1.0000 | ORAL_TABLET | Freq: Every day | ORAL | 4 refills | Status: DC

## 2016-04-08 NOTE — Telephone Encounter (Signed)
Patient was called about review of notes from the Neurologist shows that she has a complex migraine.  Even though she did not have an aura.   Consult with Dr. Sabra Heck, we feel that she should be on a POP. She is on the second week of her current pack and will change her OCP to POP. Order  For Micronor is sent to the pharmacy.  She is instructed on compliance being very important with a POP.  She is given information about how her cycle may change on this pill.  She is given other POP options such as Nexplanon and Skyla / Kyleena IUD but is not interested at this time.  If she decides on this later will need a consult visit with Dr. Sabra Heck to discuss. Order for med is sent to [facility] per request.

## 2016-08-17 ENCOUNTER — Encounter: Payer: Self-pay | Admitting: Nurse Practitioner

## 2016-09-10 ENCOUNTER — Ambulatory Visit: Payer: 59 | Admitting: Neurology

## 2016-11-26 IMAGING — MR MR HEAD W/O CM
9 series · 48 of 48 positions shown · non-contrast
Comparison: Noncontrast CT of the head performed 03/03/2016.

CLINICAL DATA: Recent seizure.

EXAM:
MRI HEAD WITHOUT CONTRAST
TECHNIQUE: Multiplanar, multiecho pulse sequences of the brain and surrounding
structures were obtained without intravenous contrast.

[Series 5: T1 · sagittal · 4.0mm · 0.75mm/px · 3 of 27 slices shown (1 of 2)]
[im 1/27]
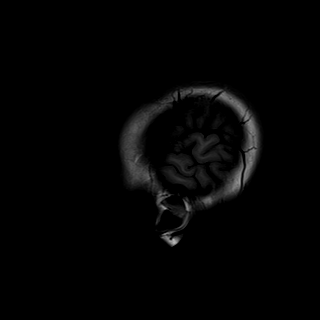
[im 14/27]
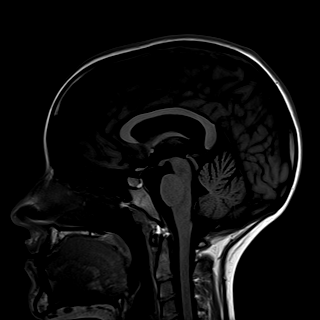
[im 27/27]
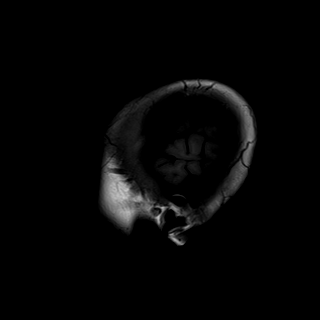

[Series 6: T2 · axial · 4.0mm · 0.36mm/px · z∈[-62,+74]mm · 3 of 28 slices shown (1 of 3)]
[im 1/28]
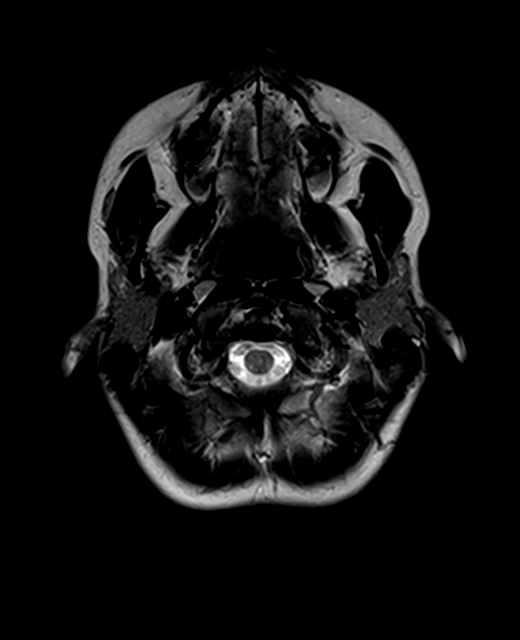
[im 14/28]
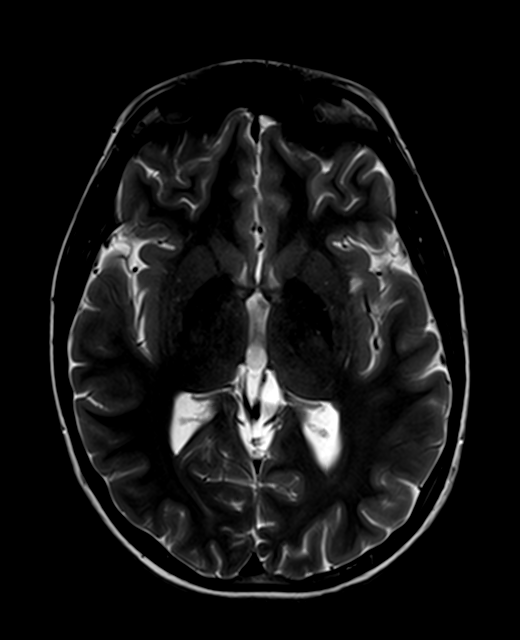
[im 28/28]
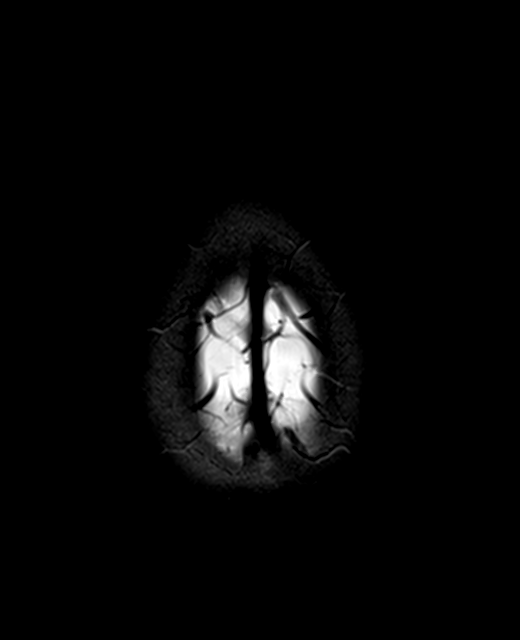

[Series 7: DWI · axial · 3.0mm · 1.44mm/px · z∈[-62,+76]mm · 9 of 88 slices shown (1 of 2)]
[im 1/88]
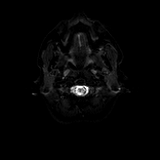
[im 11/88]
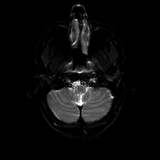
[im 22/88]
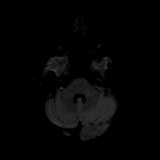
[im 33/88]
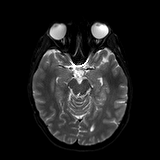
[im 44/88]
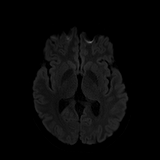
[im 55/88]
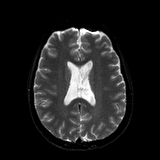
[im 66/88]
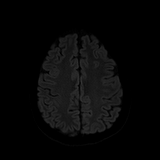
[im 77/88]
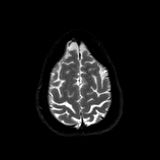
[im 88/88]
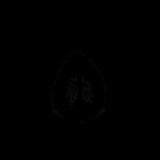

[Series 8: DWI · axial · 3.0mm · 1.44mm/px · z∈[-62,+76]mm · 4 of 44 slices shown (2 of 2)]
[im 1/44]
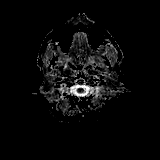
[im 15/44]
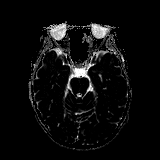
[im 29/44]
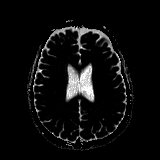
[im 44/44]
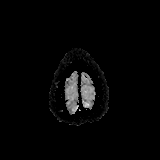

[Series 9: FLAIR · axial · 4.0mm · 0.72mm/px · z∈[-62,+74]mm · 3 of 28 slices shown]
[im 1/28]
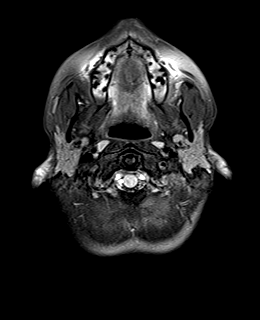
[im 14/28]
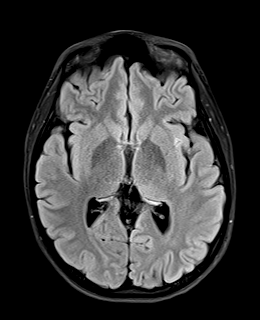
[im 28/28]
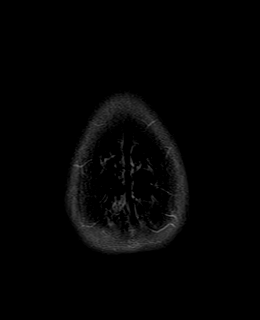

[Series 11: swi_images · axial · 2.0mm · 0.90mm/px · z∈[-62,+76]mm · 7 of 72 slices shown]
[im 1/72]
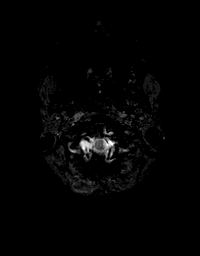
[im 12/72]
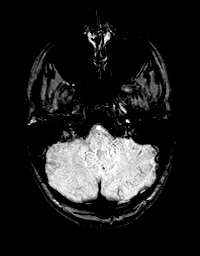
[im 24/72]
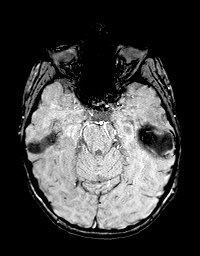
[im 36/72]
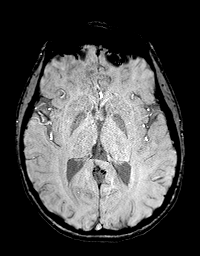
[im 48/72]
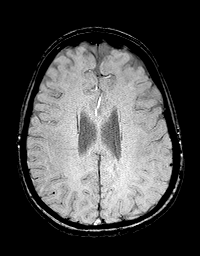
[im 60/72]
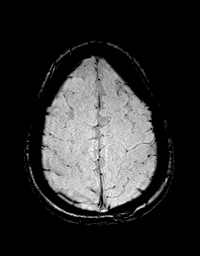
[im 72/72]
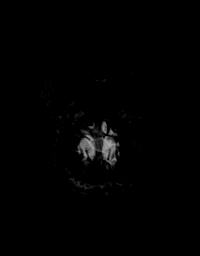

[Series 12: T1 · axial · 1.0mm · 0.90mm/px · z∈[-63,+76]mm · 14 of 144 slices shown (2 of 2)]
[im 1/144]
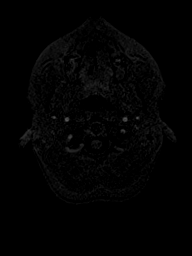
[im 12/144]
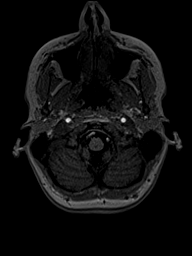
[im 23/144]
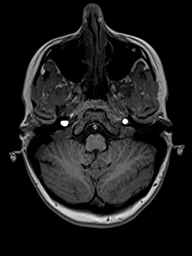
[im 34/144]
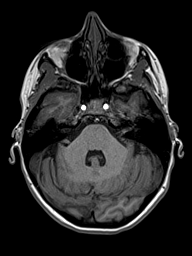
[im 45/144]
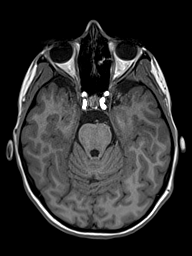
[im 56/144]
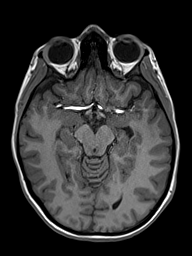
[im 67/144]
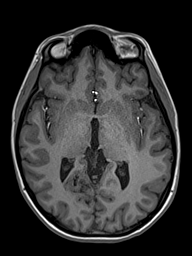
[im 78/144]
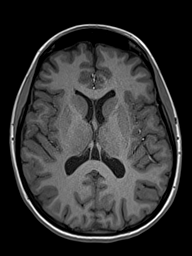
[im 89/144]
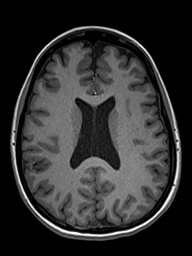
[im 100/144]
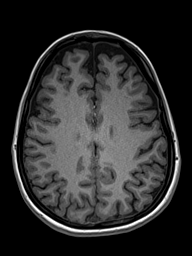
[im 111/144]
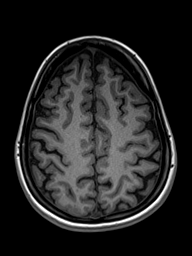
[im 122/144]
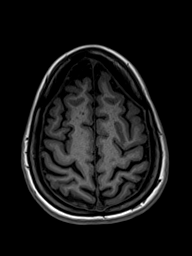
[im 133/144]
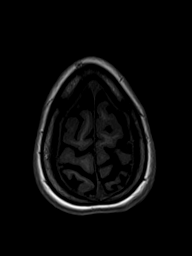
[im 144/144]
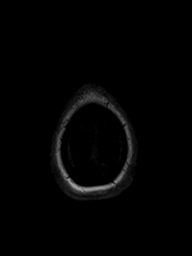

[Series 13: T2 · coronal · 3.0mm · 0.47mm/px · 2 of 25 slices shown (2 of 3)]
[im 1/25]
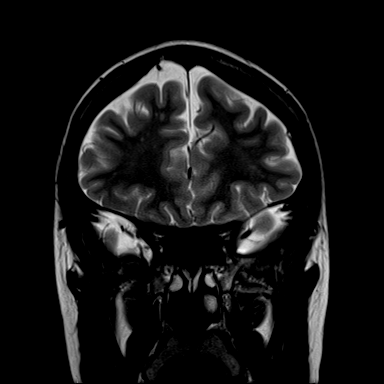
[im 25/25]
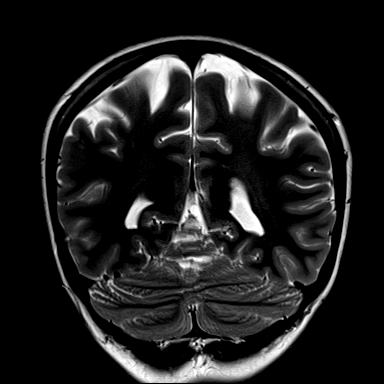

[Series 14: T2 · coronal · 4.5mm · 0.36mm/px · 3 of 30 slices shown (3 of 3)]
[im 1/30]
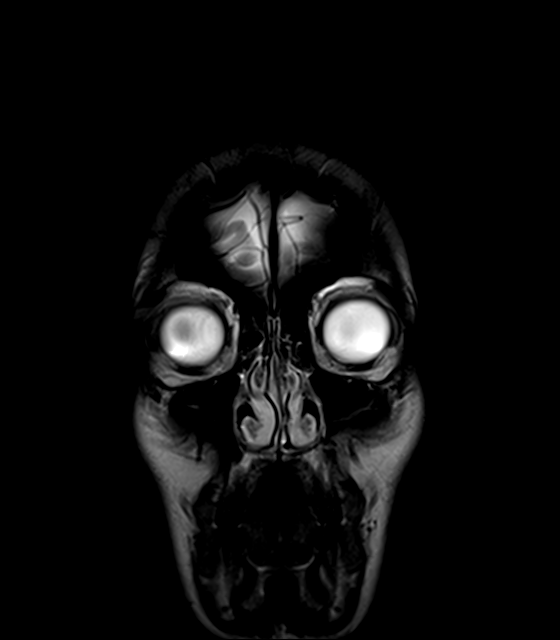
[im 15/30]
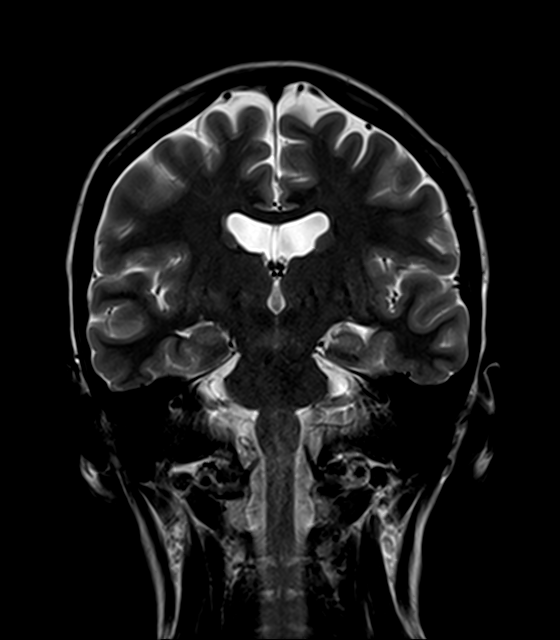
[im 30/30]
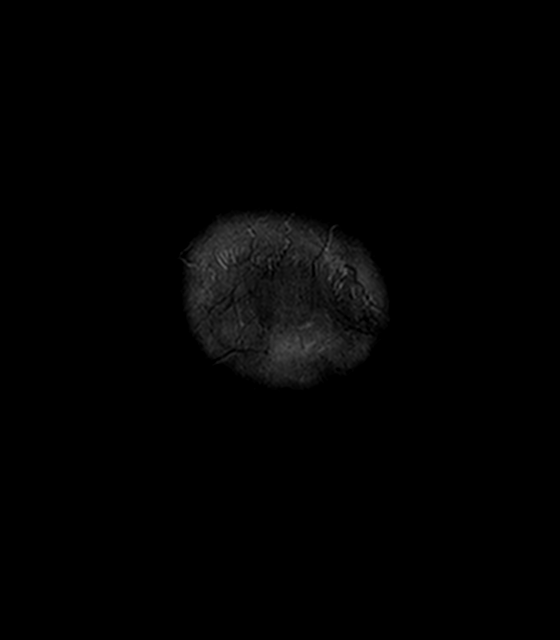

[48 of 48 positions shown; findings below may reference images not displayed]

FINDINGS: No evidence for acute infarction, hemorrhage, mass lesion,
hydrocephalus, or extra-axial fluid. Normal cerebral volume.

There are few small foci of abnormal T2 and FLAIR hyperintensity in
the frontal white matter abnormal for age. See for instance image 21
series 9. In this age group, complicated migraine is a reasonable
consideration. Demyelinating disease or chronic infection is not
favored. No evidence for vasculitis or large vessel infarct.
Correlate clinically.

Pituitary, pineal, and cerebellar tonsils unremarkable. No upper
cervical lesions.

High-resolution imaging in the coronal plane demonstrates normal
temporal lobes. No inflammatory process or focal atrophy. No mass.

Flow voids are maintained throughout the carotid, basilar, and
vertebral arteries. There are no areas of chronic hemorrhage.

Visualized calvarium, skull base, and upper cervical osseous
structures unremarkable. Scalp and extracranial soft tissues,
orbits, sinuses, and mastoids show no acute process.
IMPRESSION: Scattered (less than 10) foci of abnormal signal in the frontal
white matter abnormal for age. Complicated migraine is favored.

No intracranial mass lesion or structural abnormality which might
suggest a cause of recent seizures.

## 2017-03-10 ENCOUNTER — Telehealth: Payer: Self-pay | Admitting: Nurse Practitioner

## 2017-03-10 NOTE — Telephone Encounter (Signed)
Left message on voicemail to call and reschedule cancelled appointment. °

## 2017-04-02 ENCOUNTER — Ambulatory Visit: Payer: 59 | Admitting: Nurse Practitioner

## 2017-04-24 ENCOUNTER — Other Ambulatory Visit: Payer: Self-pay | Admitting: *Deleted

## 2017-04-24 MED ORDER — SERTRALINE HCL 25 MG PO TABS: 25.0000 mg | ORAL_TABLET | Freq: Every day | ORAL | 0 refills | Status: DC

## 2017-04-24 NOTE — Telephone Encounter (Signed)
Faxed refill request received from [facility] for ZOLOFT Last filled by PG on 04/01/16, #90 X 4 RF Last AEX - 04/01/16 PG Next AEX - not scheduled Last MMG - N/A  Message left for patient to return call to schedule annual exam.  Please advise refills. Thank you.

## 2017-04-24 NOTE — Telephone Encounter (Signed)
Refill done for #30/0RF.  Will need appt for additional RFs.

## 2017-04-29 NOTE — Telephone Encounter (Signed)
Annual exam appointment scheduled with French Ana, CNM on 06/18/17 @1 :15pm.

## 2017-06-09 ENCOUNTER — Other Ambulatory Visit: Payer: Self-pay

## 2017-06-09 NOTE — Telephone Encounter (Signed)
Medication refill request: OCP Last AEX:  04/01/16 PG Next AEX: 06/18/17 DL Last MMG (if hormonal medication request): n/a Refill authorized: 04/08/16 #3 w/ 4 refills; today please advise DL out of office

## 2017-06-12 MED ORDER — NORETHINDRONE 0.35 MG PO TABS: 1.0000 | ORAL_TABLET | Freq: Every day | ORAL | 0 refills | Status: DC

## 2017-06-18 ENCOUNTER — Encounter: Payer: Self-pay | Admitting: Certified Nurse Midwife

## 2017-06-18 ENCOUNTER — Ambulatory Visit (INDEPENDENT_AMBULATORY_CARE_PROVIDER_SITE_OTHER): Payer: 59 | Admitting: Certified Nurse Midwife

## 2017-06-18 ENCOUNTER — Other Ambulatory Visit (HOSPITAL_COMMUNITY)
Admission: RE | Admit: 2017-06-18 | Discharge: 2017-06-18 | Disposition: A | Discharge status: Home / Self Care | Payer: 59 | Source: Ambulatory Visit | Attending: Obstetrics & Gynecology | Admitting: Obstetrics & Gynecology

## 2017-06-18 VITALS — BP 108/60 | HR 64 | Resp 16 | Ht 61.75 in | Wt 117.0 lb

## 2017-06-18 DIAGNOSIS — Z124 Encounter for screening for malignant neoplasm of cervix: Secondary | ICD-10-CM | POA: Insufficient documentation

## 2017-06-18 DIAGNOSIS — Z01419 Encounter for gynecological examination (general) (routine) without abnormal findings: Secondary | ICD-10-CM

## 2017-06-18 DIAGNOSIS — Z3041 Encounter for surveillance of contraceptive pills: Secondary | ICD-10-CM

## 2017-06-18 DIAGNOSIS — Z87898 Personal history of other specified conditions: Secondary | ICD-10-CM | POA: Diagnosis not present

## 2017-06-18 DIAGNOSIS — Z0000 Encounter for general adult medical examination without abnormal findings: Secondary | ICD-10-CM

## 2017-06-18 MED ORDER — NORETHINDRONE 0.35 MG PO TABS: 1.0000 | ORAL_TABLET | Freq: Every day | ORAL | 4 refills | Status: DC

## 2017-06-18 NOTE — Patient Instructions (Signed)

## 2017-06-18 NOTE — Progress Notes (Addendum)
21 y.o. G0P0000 Single  Caucasian Erica Duarte here for annual exam. Periods normal. Contraception POP working well, has migraine history. Recent seizures again, but back on medication, sees Neurology. Sees Dr. Dellis Filbert for follow up. Feels better now she is on medication for seizures. 2 partners since break up with long time boyfriend. Desires STD screening, no concerns, but no condom use. Taking a break from college, finished her AD and plans to go back in a year. Working full time at [employer]. No other health concern today.  Patient's last menstrual period was 05/28/2017 (exact date).          Sexually active: Yes.    The current method of family planning is oral progesterone-only contraceptive.    Exercising: No.  exercise Smoker:  no  Health Maintenance: Pap:  none History of Abnormal Pap: no MMG:  none Self Breast exams: occ Colonoscopy:  none BMD:   none TDaP:  2014 Shingles: no Pneumonia: no Hep C and HIV: HIV neg 2017 Labs: if needed    reports that she has never smoked. She has never used smokeless tobacco. She reports that she drinks alcohol. She reports that she uses drugs, including Marijuana.  Past Medical History:  Diagnosis Date  . Depression   . Menorrhagia   . Migraines   . Seizures ([place])     Past Surgical History:  Procedure Laterality Date  . WISDOM TOOTH EXTRACTION      Current Outpatient Prescriptions  Medication Sig Dispense Refill  . levETIRAcetam (KEPPRA) 500 MG tablet Take 250 mg by mouth daily.   5  . norethindrone (MICRONOR,CAMILA,ERRIN) 0.35 MG tablet Take 1 tablet (0.35 mg total) by mouth daily. 3 Package 0  . sertraline (ZOLOFT) 25 MG tablet Take 1 tablet (25 mg total) by mouth daily. 30 tablet 0   No current facility-administered medications for this visit.     Family History  Problem Relation Age of Onset  . Hypertension Mother   . Thyroid disease Mother        hypo  . Hyperlipidemia Mother   . Heart attack Maternal Aunt 42  . Thyroid  disease Maternal Aunt        hyper  . Breast cancer Maternal Grandmother 60  . Depression Maternal Grandmother   . Epilepsy Maternal Grandmother   . Heart disease Maternal Grandfather     ROS:  Pertinent items are noted in HPI.  Otherwise, a comprehensive ROS was negative.  Exam:   BP 108/60   Pulse 64   Resp 16   Ht 5' 1.75" (1.568 m)   Wt 117 lb (53.1 kg)   LMP 05/28/2017 (Exact Date)   BMI 21.57 kg/m  Height: 5' 1.75" (156.8 cm) Ht Readings from Last 3 Encounters:  06/18/17 5' 1.75" (1.568 m)  04/01/16 5\' 2"  (1.575 m)  03/15/16 5\' 1"  (1.549 m)    General appearance: alert, cooperative and appears stated age Head: Normocephalic, without obvious abnormality, atraumatic Neck: no adenopathy, supple, symmetrical, trachea midline and thyroid normal to inspection and palpation Lungs: clear to auscultation bilaterally Breasts: normal appearance, no masses or tenderness, No nipple retraction or dimpling, No nipple discharge or bleeding, No axillary or supraclavicular adenopathy Heart: regular rate and rhythm Abdomen: soft, non-tender; no masses,  no organomegaly Extremities: extremities normal, atraumatic, no cyanosis or edema Skin: Skin color, texture, turgor normal. No rashes or lesions Lymph nodes: Cervical, supraclavicular, and axillary nodes normal. No abnormal inguinal nodes palpated Neurologic: Grossly normal   Pelvic: External genitalia:  no lesions              Urethra:  normal appearing urethra with no masses, tenderness or lesions              Bartholin's and Skene's: normal                 Vagina: normal appearing vagina with normal color and discharge, no lesions              Cervix: anteverted              Pap taken: Yes.   Bimanual Exam:  Uterus:  normal size, contour, position, consistency, mobility, non-tender              Adnexa: normal adnexa and no mass, fullness, tenderness               Rectovaginal: Confirms               Anus:  normal sphincter tone,  no lesions  Chaperone present: yes  A:  Well Woman with normal exam  Contraception POP desired, working well  Screening labs  P:   Reviewed health and wellness pertinent to exam  Risks/benefits/bleeding profile expectatations reviewed with POP. Also reviewed warning signs.   Rx Micronor see order with instructions  GC/Chlamydia, vaginal screen  Pap smear: yes   counseled on breast self exam, STD prevention, HIV risk factors and prevention, use and side effects of OCP's, adequate intake of calcium and vitamin D, diet and exercise at least 3 times weekly  return annually or prn  An After Visit Summary was printed and given to the patient.

## 2017-06-19 LAB — GC/CHLAMYDIA PROBE AMP
CHLAMYDIA, DNA PROBE: NEGATIVE
Neisseria gonorrhoeae by PCR: NEGATIVE

## 2017-06-19 LAB — VAGINITIS/VAGINOSIS, DNA PROBE
Candida Species: NEGATIVE
Gardnerella vaginalis: NEGATIVE
Trichomonas vaginosis: NEGATIVE

## 2017-06-20 LAB — CYTOLOGY - PAP: Diagnosis: NEGATIVE

## 2017-08-25 ENCOUNTER — Other Ambulatory Visit: Payer: Self-pay | Admitting: Obstetrics & Gynecology

## 2017-09-09 DIAGNOSIS — C4491 Basal cell carcinoma of skin, unspecified: Secondary | ICD-10-CM

## 2017-09-09 HISTORY — DX: Basal cell carcinoma of skin, unspecified: C44.91

## 2017-11-03 ENCOUNTER — Other Ambulatory Visit: Payer: Self-pay | Admitting: Certified Nurse Midwife

## 2017-11-03 DIAGNOSIS — Z3041 Encounter for surveillance of contraceptive pills: Secondary | ICD-10-CM

## 2017-11-03 NOTE — Telephone Encounter (Signed)
Patient states she needs a refill for progesterone only birth control pills sent to [facility].

## 2017-11-03 NOTE — Telephone Encounter (Signed)
Medication refill request: OCP  Last AEX:  06-18-17  Next AEX: 06-18-18  Last MMG (if hormonal medication request): N/A Refill authorized: please advise

## 2017-11-04 MED ORDER — NORETHINDRONE 0.35 MG PO TABS: 1.0000 | ORAL_TABLET | Freq: Every day | ORAL | 2 refills | Status: DC

## 2017-11-04 NOTE — Telephone Encounter (Signed)
Please look at this Rx it looks like she has refills

## 2017-11-04 NOTE — Telephone Encounter (Signed)
Yes but she needs this one sent to her mail in order -eh

## 2017-11-04 NOTE — Telephone Encounter (Signed)
Sent refill

## 2018-05-26 ENCOUNTER — Encounter: Payer: Self-pay | Admitting: Certified Nurse Midwife

## 2018-05-26 ENCOUNTER — Other Ambulatory Visit: Payer: Self-pay

## 2018-05-26 ENCOUNTER — Ambulatory Visit: Payer: BLUE CROSS/BLUE SHIELD | Admitting: Certified Nurse Midwife

## 2018-05-26 VITALS — BP 104/62 | HR 64 | Resp 16 | Ht 61.75 in | Wt 131.0 lb

## 2018-05-26 DIAGNOSIS — Z113 Encounter for screening for infections with a predominantly sexual mode of transmission: Secondary | ICD-10-CM | POA: Diagnosis not present

## 2018-05-26 DIAGNOSIS — Z01419 Encounter for gynecological examination (general) (routine) without abnormal findings: Secondary | ICD-10-CM

## 2018-05-26 NOTE — Progress Notes (Signed)
  Subjective:     Patient ID: Erica Duarte, female   DOB: 1995-12-15, 22 y.o.   MRN: [id number]  22 yo single female here with complaint of labial bump, non tender, no drainage for the past two months. Has not tried to squeeze, and feels like it may be decreasing size. Also complaining of vaginal odor for the past off and on. Denies vaginal itching or burning or increase in discharge. Has changed partners and desires STD screening. Has not been taking POP, pack seemed different. Off all her meds now but Kepra for history of seizures. No other health issues today.    Review of Systems  Constitutional: Negative.   HENT: Negative.   Eyes: Negative.   Respiratory: Negative.   Cardiovascular: Negative.   Gastrointestinal: Negative.   Genitourinary: Negative for pelvic pain, urgency, vaginal bleeding, vaginal discharge and vaginal pain.       Bump on vulva on left side  Musculoskeletal: Negative.   Allergic/Immunologic: Negative.   Neurological: Negative.   Hematological: Negative.   Psychiatric/Behavioral: The patient is nervous/anxious.        Only on Kepra now, no siezures, now off all other medications       Objective:   Physical Exam  Constitutional: She appears well-developed and well-nourished.  Abdominal: Soft.  Genitourinary: Uterus normal. There is no rash, tenderness, lesion or injury on the right labia. There is no rash, tenderness, lesion or injury on the left labia. Cervix exhibits no motion tenderness, no discharge and no friability. Right adnexum displays no mass, no tenderness and no fullness. Left adnexum displays no mass, no tenderness and no fullness. No tenderness or bleeding in the vagina. No foreign body in the vagina. Vaginal discharge found.  Lymphadenopathy: No inguinal adenopathy noted on the right or left side.  Neurological: She is alert.  Skin: Skin is warm.  Psychiatric: She has a normal mood and affect. Her speech is normal and behavior is normal. Judgment  and thought content normal.       Assessment:     Normal pelvic exam No vulva bump noted today STD screening Contraception condoms at times, has Rx for Micronor, but unsure if same pill she was taking, ? Generic change    Plan:     Discussed normal pelvic exam finding and no unusual bumps or changes noted external. ? Ingrown hair resolved. Shown area in mirror and agrees not present today. Labs: Affirm, Gc/Chlamydia, STD panel, Hep C Discussed importance of consistent use with condoms for STD protection. Patient will My Chart picture of pill pack to verify correct ones, so she can restart on next period. Questions addressed.  Rv prn

## 2018-05-27 LAB — HEPATITIS C ANTIBODY: Hep C Virus Ab: <0.1 s/co ratio (ref 0.0–0.9)

## 2018-05-27 LAB — VAGINITIS/VAGINOSIS, DNA PROBE
Candida Species: NEGATIVE
Gardnerella vaginalis: POSITIVE — AB
TRICHOMONAS VAG: NEGATIVE

## 2018-05-27 LAB — HEP, RPR, HIV PANEL
HIV SCREEN 4TH GENERATION: NONREACTIVE
Hepatitis B Surface Ag: NEGATIVE
RPR Ser Ql: NONREACTIVE

## 2018-05-28 ENCOUNTER — Telehealth: Payer: Self-pay

## 2018-05-28 LAB — GC/CHLAMYDIA PROBE AMP
Chlamydia trachomatis, NAA: NEGATIVE
Neisseria gonorrhoeae by PCR: NEGATIVE

## 2018-05-28 MED ORDER — METRONIDAZOLE 500 MG PO TABS: 500.0000 mg | ORAL_TABLET | Freq: Two times a day (BID) | ORAL | 0 refills | Status: DC

## 2018-05-28 NOTE — Telephone Encounter (Signed)
Pt notified of results & rx sent to pharmacy for flagyl.

## 2018-05-28 NOTE — Telephone Encounter (Signed)
Patient returning call to Joy.  

## 2018-05-28 NOTE — Telephone Encounter (Signed)
Left message for call back.

## 2018-05-28 NOTE — Telephone Encounter (Signed)
-----   Message from Regina Eck, CNM sent at 05/28/2018  7:34 AM EDT ----- Notify patient her vaginal screen was positive for BV only. Yeast and trichomonas negative If no concerns for pregnancy, she had not been taking POP can do either Flagyl 500 mg bid x 7 or Metrogel vaginal every hs x 7 Hep b,c HIV, RPR are negative Gc/Chlamydia pending

## 2018-06-18 ENCOUNTER — Ambulatory Visit: Payer: 59 | Admitting: Certified Nurse Midwife

## 2018-06-19 ENCOUNTER — Ambulatory Visit: Payer: BLUE CROSS/BLUE SHIELD | Admitting: Certified Nurse Midwife

## 2018-06-19 ENCOUNTER — Other Ambulatory Visit: Payer: Self-pay

## 2018-06-19 ENCOUNTER — Encounter: Payer: Self-pay | Admitting: Certified Nurse Midwife

## 2018-06-19 VITALS — BP 102/62 | HR 68 | Resp 16 | Ht 62.25 in | Wt 127.0 lb

## 2018-06-19 DIAGNOSIS — Z87898 Personal history of other specified conditions: Secondary | ICD-10-CM

## 2018-06-19 DIAGNOSIS — Z01419 Encounter for gynecological examination (general) (routine) without abnormal findings: Secondary | ICD-10-CM | POA: Diagnosis not present

## 2018-06-19 DIAGNOSIS — Z113 Encounter for screening for infections with a predominantly sexual mode of transmission: Secondary | ICD-10-CM | POA: Diagnosis not present

## 2018-06-19 DIAGNOSIS — Z30011 Encounter for initial prescription of contraceptive pills: Secondary | ICD-10-CM | POA: Diagnosis not present

## 2018-06-19 MED ORDER — NORETHINDRONE 0.35 MG PO TABS: 1.0000 | ORAL_TABLET | Freq: Every day | ORAL | 3 refills | Status: DC

## 2018-06-19 NOTE — Progress Notes (Signed)
22 y.o. G0P0000 Single  Caucasian Fe here for annual exam. Periods normal , but started early this time due to taking plan B and now having bleeding from. New old partner, desires STD screening, vaginal only.Marland Kitchen Continues to take Kepra for seizures and has none. Aware she should not use estrogen products. Plans to restart POP and will need new Rx Errin to mail order. No other health issues today.  Patient's last menstrual period was 06/03/2018 (exact date).          Sexually active: Yes.    The current method of family planning is condoms most of the time.    Exercising: No.  exercise Smoker:  no  Review of Systems  Constitutional: Negative.   HENT: Negative.   Eyes: Negative.   Respiratory: Negative.   Cardiovascular: Negative.   Gastrointestinal: Negative.   Genitourinary: Negative.   Musculoskeletal: Negative.   Skin: Negative.   Neurological: Negative.   Endo/Heme/Allergies: Negative.   Psychiatric/Behavioral: Negative.     Health Maintenance: Pap:  06-18-17 neg History of Abnormal Pap: no MMG:  none Self Breast exams: no Colonoscopy:  none BMD:   none TDaP:  2014 Shingles: no Pneumonia: no Hep C and HIV: both neg 2019 Labs: if needed   reports that she has never smoked. She has never used smokeless tobacco. She reports that she drinks about 3.0 standard drinks of alcohol per week. She reports that she has current or past drug history. Drug: Marijuana.  Past Medical History:  Diagnosis Date  . Basal cell carcinoma 2019   spot on forehead  . Depression   . Menorrhagia   . Migraines   . Seizures ([place])     Past Surgical History:  Procedure Laterality Date  . BASAL CELL CARCINOMA EXCISION     forehead  . WISDOM TOOTH EXTRACTION      Current Outpatient Medications  Medication Sig Dispense Refill  . levETIRAcetam (KEPPRA) 500 MG tablet Take 250 mg by mouth daily.   5   No current facility-administered medications for this visit.     Family History  Problem  Relation Age of Onset  . Hypertension Mother   . Thyroid disease Mother        hypo  . Hyperlipidemia Mother   . Heart attack Maternal Aunt 42  . Thyroid disease Maternal Aunt        hyper  . Breast cancer Maternal Grandmother 60  . Depression Maternal Grandmother   . Epilepsy Maternal Grandmother   . Heart disease Maternal Grandfather     ROS:  Pertinent items are noted in HPI.  Otherwise, a comprehensive ROS was negative.  Exam:   BP 102/62   Pulse 68   Resp 16   Ht 5' 2.25" (1.581 m)   Wt 127 lb (57.6 kg)   LMP 06/03/2018 (Exact Date)   BMI 23.04 kg/m  Height: 5' 2.25" (158.1 cm) Ht Readings from Last 3 Encounters:  06/19/18 5' 2.25" (1.581 m)  05/26/18 5' 1.75" (1.568 m)  06/18/17 5' 1.75" (1.568 m)    General appearance: alert, cooperative and appears stated age Head: Normocephalic, without obvious abnormality, atraumatic Neck: no adenopathy, supple, symmetrical, trachea midline and thyroid normal to inspection and palpation Lungs: clear to auscultation bilaterally Breasts: normal appearance, no masses or tenderness, No nipple retraction or dimpling, No nipple discharge or bleeding, No axillary or supraclavicular adenopathy Heart: regular rate and rhythm Abdomen: soft, non-tender; no masses,  no organomegaly Extremities: extremities normal, atraumatic, no cyanosis or  edema Skin: Skin color, texture, turgor normal. No rashes or lesions Lymph nodes: Cervical, supraclavicular, and axillary nodes normal. No abnormal inguinal nodes palpated Neurologic: Grossly normal   Pelvic: External genitalia:  no lesions              Urethra:  normal appearing urethra with no masses, tenderness or lesions              Bartholin's and Skene's: normal                 Vagina: normal appearing vagina with normal color and discharge, no lesions              Cervix: no cervical motion tenderness, no lesions and nulliparous appearance              Pap taken: No. Bimanual Exam:  Uterus:   normal size, contour, position, consistency, mobility, non-tender and anteverted              Adnexa: normal adnexa and no mass, fullness, tenderness               Rectovaginal: Confirms               Anus:  normal sphincter tone, no lesions  Chaperone present: yes  A:  Well Woman with normal exam  Contraception condoms, desires starting POP again  History of seizures with Neurology management and on Kepra  Vaginal STD screening only  P:   Reviewed health and wellness pertinent to exam  Risks/benefits/warning signs and bleeding expectations with POP discussed.  Rx Errin see order with instructions  Continue follow up with Neurology as indicated  Lab: Gc/chlamydia, Affirm  Pap smear: no   counseled on breast self exam, STD prevention, HIV risk factors and prevention, feminine hygiene, adequate intake of calcium and vitamin D, diet and exercise  return annually or prn  An After Visit Summary was printed and given to the patient.

## 2018-06-19 NOTE — Patient Instructions (Signed)
General topics  Next pap or exam is  due in 1 year Take a Women's multivitamin Take 1200 mg. of calcium daily - prefer dietary If any concerns in interim to call back  Breast Self-Awareness Practicing breast self-awareness may pick up problems early, prevent significant medical complications, and possibly save your life. By practicing breast self-awareness, you can become familiar with how your breasts look and feel and if your breasts are changing. This allows you to notice changes early. It can also offer you some reassurance that your breast health is good. One way to learn what is normal for your breasts and whether your breasts are changing is to do a breast self-exam. If you find a lump or something that was not present in the past, it is best to contact your caregiver right away. Other findings that should be evaluated by your caregiver include nipple discharge, especially if it is bloody; skin changes or reddening; areas where the skin seems to be pulled in (retracted); or new lumps and bumps. Breast pain is seldom associated with cancer (malignancy), but should also be evaluated by a caregiver. BREAST SELF-EXAM The best time to examine your breasts is 5 7 days after your menstrual period is over.  ExitCare Patient Information 2013 [employer].   Exercise to Stay Healthy Exercise helps you become and stay healthy. EXERCISE IDEAS AND TIPS Choose exercises that:  You enjoy.  Fit into your day. You do not need to exercise really hard to be healthy. You can do exercises at a slow or medium level and stay healthy. You can:  Stretch before and after working out.  Try yoga, Pilates, or tai chi.  Lift weights.  Walk fast, swim, jog, run, climb stairs, bicycle, dance, or rollerskate.  Take aerobic classes. Exercises that burn about 150 calories:  Running 1  miles in 15 minutes.  Playing volleyball for 45 to 60 minutes.  Washing and waxing a car for 45 to 60  minutes.  Playing touch football for 45 minutes.  Walking 1  miles in 35 minutes.  Pushing a stroller 1  miles in 30 minutes.  Playing basketball for 30 minutes.  Raking leaves for 30 minutes.  Bicycling 5 miles in 30 minutes.  Walking 2 miles in 30 minutes.  Dancing for 30 minutes.  Shoveling snow for 15 minutes.  Swimming laps for 20 minutes.  Walking up stairs for 15 minutes.  Bicycling 4 miles in 15 minutes.  Gardening for 30 to 45 minutes.  Jumping rope for 15 minutes.  Washing windows or floors for 45 to 60 minutes. Document Released: 09/28/2010 Document Revised: 11/18/2011 Document Reviewed: 09/28/2010 ExitCare Patient Information 2013 [employer].   Other topics ( that may be useful information):    Sexually Transmitted Disease Sexually transmitted disease (STD) refers to any infection that is passed from person to person during sexual activity. This may happen by way of saliva, semen, blood, vaginal mucus, or urine. Common STDs include:  Gonorrhea.  Chlamydia.  Syphilis.  HIV/AIDS.  Genital herpes.  Hepatitis B and C.  Trichomonas.  Human papillomavirus (HPV).  Pubic lice. CAUSES  An STD may be spread by bacteria, virus, or parasite. A person can get an STD by:  Sexual intercourse with an infected person.  Sharing sex toys with an infected person.  Sharing needles with an infected person.  Having intimate contact with the genitals, mouth, or rectal areas of an infected person. SYMPTOMS  Some people may not have any symptoms, but   they can still pass the infection to others. Different STDs have different symptoms. Symptoms include:  Painful or bloody urination.  Pain in the pelvis, abdomen, vagina, anus, throat, or eyes.  Skin rash, itching, irritation, growths, or sores (lesions). These usually occur in the genital or anal area.  Abnormal vaginal discharge.  Penile discharge in men.  Soft, flesh-colored skin growths in the  genital or anal area.  Fever.  Pain or bleeding during sexual intercourse.  Swollen glands in the groin area.  Yellow skin and eyes (jaundice). This is seen with hepatitis. DIAGNOSIS  To make a diagnosis, your caregiver may:  Take a medical history.  Perform a physical exam.  Take a specimen (culture) to be examined.  Examine a sample of discharge under a microscope.  Perform blood test TREATMENT   Chlamydia, gonorrhea, trichomonas, and syphilis can be cured with antibiotic medicine.  Genital herpes, hepatitis, and HIV can be treated, but not cured, with prescribed medicines. The medicines will lessen the symptoms.  Genital warts from HPV can be treated with medicine or by freezing, burning (electrocautery), or surgery. Warts may come back.  HPV is a virus and cannot be cured with medicine or surgery.However, abnormal areas may be followed very closely by your caregiver and may be removed from the cervix, vagina, or vulva through office procedures or surgery. If your diagnosis is confirmed, your recent sexual partners need treatment. This is true even if they are symptom-free or have a negative culture or evaluation. They should not have sex until their caregiver says it is okay. HOME CARE INSTRUCTIONS  All sexual partners should be informed, tested, and treated for all STDs.  Take your antibiotics as directed. Finish them even if you start to feel better.  Only take over-the-counter or prescription medicines for pain, discomfort, or fever as directed by your caregiver.  Rest.  Eat a balanced diet and drink enough fluids to keep your urine clear or pale yellow.  Do not have sex until treatment is completed and you have followed up with your caregiver. STDs should be checked after treatment.  Keep all follow-up appointments, Pap tests, and blood tests as directed by your caregiver.  Only use latex condoms and water-soluble lubricants during sexual activity. Do not use  petroleum jelly or oils.  Avoid alcohol and illegal drugs.  Get vaccinated for HPV and hepatitis. If you have not received these vaccines in the past, talk to your caregiver about whether one or both might be right for you.  Avoid risky sex practices that can break the skin. The only way to avoid getting an STD is to avoid all sexual activity.Latex condoms and dental dams (for oral sex) will help lessen the risk of getting an STD, but will not completely eliminate the risk. SEEK MEDICAL CARE IF:   You have a fever.  You have any new or worsening symptoms. Document Released: 11/16/2002 Document Revised: 11/18/2011 Document Reviewed: 11/23/2010 [facility] Patient Information 2013 [employer].    Domestic Abuse You are being battered or abused if someone close to you hits, pushes, or physically hurts you in any way. You also are being abused if you are forced into activities. You are being sexually abused if you are forced to have sexual contact of any kind. You are being emotionally abused if you are made to feel worthless or if you are constantly threatened. It is important to remember that help is available. No one has the right to abuse you. PREVENTION OF FURTHER  ABUSE  Learn the warning signs of danger. This varies with situations but may include: the use of alcohol, threats, isolation from friends and family, or forced sexual contact. Leave if you feel that violence is going to occur.  If you are attacked or beaten, report it to the police so the abuse is documented. You do not have to press charges. The police can protect you while you or the attackers are leaving. Get the officer's name and badge number and a copy of the report.  Find someone you can trust and tell them what is happening to you: your caregiver, a nurse, clergy member, close friend or family member. Feeling ashamed is natural, but remember that you have done nothing wrong. No one deserves abuse. Document Released:  08/23/2000 Document Revised: 11/18/2011 Document Reviewed: 11/01/2010 ExitCare Patient Information 2013 [employer].    How Much is Too Much Alcohol? Drinking too much alcohol can cause injury, accidents, and health problems. These types of problems can include:   Car crashes.  Falls.  Family fighting (domestic violence).  Drowning.  Fights.  Injuries.  Burns.  Damage to certain organs.  Having a baby with birth defects. ONE DRINK CAN BE TOO MUCH WHEN YOU ARE:  Working.  Pregnant or breastfeeding.  Taking medicines. Ask your doctor.  Driving or planning to drive. If you or someone you know has a drinking problem, get help from a doctor.  Document Released: 06/22/2009 Document Revised: 11/18/2011 Document Reviewed: 06/22/2009 ExitCare Patient Information 2013 [employer].   Smoking Hazards Smoking cigarettes is extremely bad for your health. Tobacco smoke has over 200 known poisons in it. There are over 60 chemicals in tobacco smoke that cause cancer. Some of the chemicals found in cigarette smoke include:   Cyanide.  Benzene.  Formaldehyde.  Methanol (wood alcohol).  Acetylene (fuel used in welding torches).  Ammonia. Cigarette smoke also contains the poisonous gases nitrogen oxide and carbon monoxide.  Cigarette smokers have an increased risk of many serious medical problems and Smoking causes approximately:  90% of all lung cancer deaths in men.  80% of all lung cancer deaths in women.  90% of deaths from chronic obstructive lung disease. Compared with nonsmokers, smoking increases the risk of:  Coronary heart disease by 2 to 4 times.  Stroke by 2 to 4 times.  Men developing lung cancer by 23 times.  Women developing lung cancer by 13 times.  Dying from chronic obstructive lung diseases by 12 times.  . Smoking is the most preventable cause of death and disease in our society.  WHY IS SMOKING ADDICTIVE?  Nicotine is the chemical  agent in tobacco that is capable of causing addiction or dependence.  When you smoke and inhale, nicotine is absorbed rapidly into the bloodstream through your lungs. Nicotine absorbed through the lungs is capable of creating a powerful addiction. Both inhaled and non-inhaled nicotine may be addictive.  Addiction studies of cigarettes and spit tobacco show that addiction to nicotine occurs mainly during the teen years, when young people begin using tobacco products. WHAT ARE THE BENEFITS OF QUITTING?  There are many health benefits to quitting smoking.   Likelihood of developing cancer and heart disease decreases. Health improvements are seen almost immediately.  Blood pressure, pulse rate, and breathing patterns start returning to normal soon after quitting. QUITTING SMOKING   American Lung Association - 1-800-LUNGUSA  American Cancer Society - 1-800-ACS-2345 Document Released: 10/03/2004 Document Revised: 11/18/2011 Document Reviewed: 06/07/2009 ExitCare Patient Information 2013 ExitCare[employer]   LL[employer].   Stress Management Stress is a state of physical or mental tension that often results from changes in your life or normal routine. Some common causes of stress are:  Death of a loved one.  Injuries or severe illnesses.  Getting fired or changing jobs.  Moving into a new home. Other causes may be:  Sexual problems.  Business or financial losses.  Taking on a large debt.  Regular conflict with someone at home or at work.  Constant tiredness from lack of sleep. It is not just bad things that are stressful. It may be stressful to:  Win the lottery.  Get married.  Buy a new car. The amount of stress that can be easily tolerated varies from person to person. Changes generally cause stress, regardless of the types of change. Too much stress can affect your health. It may lead to physical or emotional problems. Too little stress (boredom) may also become stressful. SUGGESTIONS TO  REDUCE STRESS:  Talk things over with your family and friends. It often is helpful to share your concerns and worries. If you feel your problem is serious, you may want to get help from a professional counselor.  Consider your problems one at a time instead of lumping them all together. Trying to take care of everything at once may seem impossible. List all the things you need to do and then start with the most important one. Set a goal to accomplish 2 or 3 things each day. If you expect to do too many in a single day you will naturally fail, causing you to feel even more stressed.  Do not use alcohol or drugs to relieve stress. Although you may feel better for a short time, they do not remove the problems that caused the stress. They can also be habit forming.  Exercise regularly - at least 3 times per week. Physical exercise can help to relieve that "uptight" feeling and will relax you.  The shortest distance between despair and hope is often a good night's sleep.  Go to bed and get up on time allowing yourself time for appointments without being rushed.  Take a short "time-out" period from any stressful situation that occurs during the day. Close your eyes and take some deep breaths. Starting with the muscles in your face, tense them, hold it for a few seconds, then relax. Repeat this with the muscles in your neck, shoulders, hand, stomach, back and legs.  Take good care of yourself. Eat a balanced diet and get plenty of rest.  Schedule time for having fun. Take a break from your daily routine to relax. HOME CARE INSTRUCTIONS   Call if you feel overwhelmed by your problems and feel you can no longer manage them on your own.  Return immediately if you feel like hurting yourself or someone else. Document Released: 02/19/2001 Document Revised: 11/18/2011 Document Reviewed: 10/12/2007 ExitCare Patient Information 2013 [employer].   

## 2018-06-20 LAB — VAGINITIS/VAGINOSIS, DNA PROBE
Candida Species: NEGATIVE
Gardnerella vaginalis: NEGATIVE
Trichomonas vaginosis: NEGATIVE

## 2018-06-20 LAB — GC/CHLAMYDIA PROBE AMP
Chlamydia trachomatis, NAA: NEGATIVE
NEISSERIA GONORRHOEAE BY PCR: NEGATIVE

## 2018-07-29 ENCOUNTER — Telehealth: Payer: Self-pay | Admitting: Certified Nurse Midwife

## 2018-07-29 NOTE — Telephone Encounter (Signed)
erro  neous encounter

## 2018-10-13 ENCOUNTER — Other Ambulatory Visit: Payer: Self-pay | Admitting: Certified Nurse Midwife

## 2018-10-13 DIAGNOSIS — Z30011 Encounter for initial prescription of contraceptive pills: Secondary | ICD-10-CM

## 2018-10-13 NOTE — Telephone Encounter (Signed)
Patient is asking for a refill of her birth control to [facility] on [address].

## 2018-10-13 NOTE — Telephone Encounter (Signed)
Rx was sent at aex 10/19 to mail order.  Medication refill request: micronor Last AEX:  06-19-18 Next AEX: 06-23-2019 Last MMG (if hormonal medication request): none Refill authorized: patient states she is no longer using mail order & needs rx sent to last until next aex. Please approve if appropriate.

## 2018-10-14 MED ORDER — NORETHINDRONE 0.35 MG PO TABS: 1.0000 | ORAL_TABLET | Freq: Every day | ORAL | 2 refills | Status: DC

## 2018-10-14 NOTE — Telephone Encounter (Signed)
rx sent to pharmacy to last until aex 10/20

## 2019-06-23 ENCOUNTER — Ambulatory Visit: Payer: BLUE CROSS/BLUE SHIELD | Admitting: Certified Nurse Midwife

## 2019-06-25 ENCOUNTER — Other Ambulatory Visit: Payer: Self-pay | Admitting: Certified Nurse Midwife

## 2019-06-25 DIAGNOSIS — Z30011 Encounter for initial prescription of contraceptive pills: Secondary | ICD-10-CM

## 2019-12-01 ENCOUNTER — Encounter: Payer: Self-pay | Admitting: Certified Nurse Midwife

## 2021-04-21 DIAGNOSIS — Z025 Encounter for examination for participation in sport: Secondary | ICD-10-CM | POA: Diagnosis not present

## 2023-04-13 ENCOUNTER — Emergency Department (HOSPITAL_BASED_OUTPATIENT_CLINIC_OR_DEPARTMENT_OTHER)
Admission: EM | Admit: 2023-04-13 | Discharge: 2023-04-13 | Disposition: A | Discharge status: Home / Self Care | Payer: 59 | Attending: Emergency Medicine | Admitting: Emergency Medicine

## 2023-04-13 ENCOUNTER — Other Ambulatory Visit: Payer: Self-pay

## 2023-04-13 ENCOUNTER — Encounter (HOSPITAL_BASED_OUTPATIENT_CLINIC_OR_DEPARTMENT_OTHER): Payer: Self-pay | Admitting: Emergency Medicine

## 2023-04-13 DIAGNOSIS — J029 Acute pharyngitis, unspecified: Secondary | ICD-10-CM

## 2023-04-13 DIAGNOSIS — B9789 Other viral agents as the cause of diseases classified elsewhere: Secondary | ICD-10-CM | POA: Insufficient documentation

## 2023-04-13 DIAGNOSIS — R0789 Other chest pain: Secondary | ICD-10-CM | POA: Diagnosis not present

## 2023-04-13 DIAGNOSIS — J028 Acute pharyngitis due to other specified organisms: Secondary | ICD-10-CM | POA: Diagnosis not present

## 2023-04-13 DIAGNOSIS — H109 Unspecified conjunctivitis: Secondary | ICD-10-CM | POA: Diagnosis present

## 2023-04-13 LAB — MONONUCLEOSIS SCREEN: Mono Screen: NEGATIVE

## 2023-04-13 LAB — GROUP A STREP BY PCR: Group A Strep by PCR: NOT DETECTED

## 2023-04-13 NOTE — Discharge Instructions (Signed)
You are negative for strep or mononucleosis. You likely have not fully improved because you have a viral infection.  I recommend supportive care including Tylenol, throat lozenges, Cepacol spray, gargling with warm salt water. Follow-up at a primary care physician or your urgent care if you feel like you are not getting any better. Seek help at the emergency department for the following reasons: You have difficulty breathing. You cannot swallow fluids, soft foods, or your saliva. You have increased swelling in your throat or neck. You have persistent nausea and vomiting.

## 2023-04-13 NOTE — ED Triage Notes (Signed)
Reports worsening cold symptoms that started 2.5 weeks ago. States started as a chest cold, progressed to sinus infection, then to BL conjunctivitis and now a sore throat. Sx unresolved with one course of abx. BL conjunctivitis resolved with abx eyedrops.

## 2023-04-13 NOTE — ED Provider Notes (Signed)
[place] [facility] Provider Note   CSN: [id number] Arrival date & time: 04/13/23  1608     History  Chief Complaint  Patient presents with   Sore Throat    Erica Duarte is a 27 y.o. female for eye symptoms.  She had 2 weeks of ongoing symptoms.  She states that symptoms began with chest discomfort and cold.  It then progressed upward into her nose and face.  She had sinus pressure and bilateral conjunctivitis.  She [facility] that [facility] and [facility] treated with eyedrops and Augmentin.  Patient states that although her eyes get better she still has symptoms and then has had worsening sore throat and tonsillar lymphadenopathy.   Sore Throat       Home Medications Prior to Admission medications   Medication Sig Start Date End Date Taking? Authorizing Provider  clindamycin (CLINDAGEL) 1 % gel  09/11/18   [provider]  doxycycline (VIBRA-TABS) 100 MG tablet  10/07/18   [provider]  levETIRAcetam (KEPPRA) 500 MG tablet Take 250 mg by mouth daily.  06/05/17   [provider]  nitrofurantoin, macrocrystal-monohydrate, (MACROBID) 100 MG capsule  07/29/18   [provider]  norethindrone (MICRONOR,CAMILA,ERRIN) 0.35 MG tablet Take 1 tablet (0.35 mg total) by mouth daily. 10/14/18   Verner Chol, CNM      Allergies    Patient has no known allergies.    Review of Systems   Review of Systems  Physical Exam Updated Vital Signs BP 108/66 (BP Location: Left Arm)   Pulse 74   Temp 98.1 F (36.7 C) (Oral)   Resp 17   Ht 5\' 1"  (1.549 m)   Wt 52.2 kg   LMP 03/28/2023 (Exact Date)   SpO2 98%   BMI 21.73 kg/m  Physical Exam Vitals and nursing note reviewed.  Constitutional:      General: She is not in acute distress.    Appearance: She is well-developed. She is not diaphoretic.  HENT:     Head: Normocephalic and atraumatic.     Right Ear: External ear normal.      Left Ear: External ear normal.     Nose: Nose normal.     Mouth/Throat:     Mouth: Mucous membranes are moist.  Eyes:     General: No scleral icterus.    Conjunctiva/sclera: Conjunctivae normal.  Cardiovascular:     Rate and Rhythm: Normal rate and regular rhythm.     Heart sounds: Normal heart sounds. No murmur heard.    No friction rub. No gallop.  Pulmonary:     Effort: Pulmonary effort is normal. No respiratory distress.     Breath sounds: Normal breath sounds.  Abdominal:     General: Bowel sounds are normal. There is no distension.     Palpations: Abdomen is soft. There is no mass.     Tenderness: There is no abdominal tenderness. There is no guarding.  Musculoskeletal:     Cervical back: Normal range of motion.  Skin:    General: Skin is warm and dry.  Neurological:     Mental Status: She is alert and oriented to person, place, and time.  Psychiatric:        Behavior: Behavior normal.     ED Results / Procedures / Treatments   Labs (all labs ordered are listed, but only abnormal results are displayed) Labs Reviewed - No data to display  EKG None  Radiology No results found.  Procedures Procedures    Medications Ordered in ED Medications - No data to display  ED Course/ Medical Decision Making/ A&P                                 Medical Decision Making Amount and/or Complexity of Data Reviewed Labs: ordered.    Pt afebrile without tonsillar exudate, negative strep. Presents with mild cervical lymphadenopathy, & dysphagia; diagnosis of viral pharyngitis. No abx indicated. DC w symptomatic tx for pain  Pt does not appear dehydrated, but did discuss importance of water rehydration. Presentation non concerning for PTA or infxn spread to soft tissue. No trismus or uvula deviation. Specific return precautions discussed. Pt able to drink water in ED without difficulty with intact air way. Recommended PCP follow up.         Final Clinical Impression(s)  / ED Diagnoses Final diagnoses:  None    Rx / DC Orders ED Discharge Orders     None         Arthor Captain, PA-C 04/16/23 2151    Virgina Norfolk, DO 04/22/23 1923

## 2023-04-18 ENCOUNTER — Ambulatory Visit
Admission: RE | Admit: 2023-04-18 | Discharge: 2023-04-18 | Disposition: A | Discharge status: Home / Self Care | Payer: 59 | Source: Ambulatory Visit | Attending: Internal Medicine | Admitting: Internal Medicine

## 2023-04-18 VITALS — BP 101/72 | HR 65 | Temp 99.0°F | Resp 16

## 2023-04-18 DIAGNOSIS — B9689 Other specified bacterial agents as the cause of diseases classified elsewhere: Secondary | ICD-10-CM | POA: Diagnosis not present

## 2023-04-18 DIAGNOSIS — H1032 Unspecified acute conjunctivitis, left eye: Secondary | ICD-10-CM | POA: Diagnosis not present

## 2023-04-18 DIAGNOSIS — J208 Acute bronchitis due to other specified organisms: Secondary | ICD-10-CM | POA: Diagnosis not present

## 2023-04-18 MED ORDER — PROMETHAZINE-DM 6.25-15 MG/5ML PO SYRP
5.0000 mL | ORAL_SOLUTION | Freq: Four times a day (QID) | ORAL | 0 refills | Status: DC | PRN
Start: 2023-04-18 — End: 2023-07-15

## 2023-04-18 MED ORDER — DOXYCYCLINE HYCLATE 100 MG PO CAPS
100.0000 mg | ORAL_CAPSULE | Freq: Two times a day (BID) | ORAL | 0 refills | Status: AC
Start: 2023-04-18 — End: 2023-04-25

## 2023-04-18 MED ORDER — AZELASTINE HCL 0.05 % OP SOLN: 1.0000 [drp] | Freq: Two times a day (BID) | OPHTHALMIC | 0 refills | Status: AC

## 2023-04-18 NOTE — Discharge Instructions (Signed)
You may continue your antibiotic eyedrops as previously prescribed.  If you do not have any improvement after 24 to 48 hours you may stop this and start azelastine and histamine eyedrops twice daily for 7 days.  Warm compresses to the eye as needed. Start doxycycline twice daily for 7 days.  Promethazine DM as needed for cough.  Please note this medication make you drowsy.  Do not drink alcohol or drive while taking these medications.  Lots of rest and fluids.  Please follow-up with your PCP if your symptoms do not improve.  Please go to the ER for any worsening symptoms.  Hope you feel better soon!

## 2023-04-18 NOTE — ED Triage Notes (Signed)
Pt presents to UC w/ c/o sore throat, cough, conjunctivitis left eye x3 weeks. Pt states she received treatment but symptoms returned after one day.\

## 2023-04-18 NOTE — ED Provider Notes (Signed)
[facility]    CSN: [id number] Arrival date & time: 04/18/23  1506      History   Chief Complaint Chief Complaint  Patient presents with   Conjunctivitis    Had pink eye and sinus infection 3 weeks ago, treated with antibiotics and eye drops. Still sick and redeveloped pink eye again last night. - Entered by patient    HPI Erica Duarte is a 27 y.o. female  presents for evaluation of URI symptoms for 3 weeks. Patient reports associated symptoms of mildly productive cough with congestion, sore throat, and left eye intermittent drainage.. Denies N/V/D, fevers, ear pain, body aches, shortness of breath. Patient does not have a hx of asthma or smoking. No known sick contacts.  [facility].  Was prescribed Augmentin and ciprofloxacin eyedrops.  Patient reports symptoms improved but did not completely resolved.  She was then seen on August 4 in the ER for sore throat.  She had a negative strep PCR as well as monotest.  She was advised viral treatment.  Pt has taken Mucinex OTC for symptoms. Pt has no other concerns at this time.    Conjunctivitis    Past Medical History:  Diagnosis Date   Basal cell carcinoma 2019   spot on forehead   Depression    Menorrhagia    Migraines    Seizures (HCC)     Patient Active Problem List   Diagnosis Date Noted   Partial seizure (HCC) 03/03/2016   Depression 03/03/2016   Complicated migraine 03/03/2016    Past Surgical History:  Procedure Laterality Date   BASAL CELL CARCINOMA EXCISION     forehead   WISDOM TOOTH EXTRACTION      OB History     Gravida  0   Para  0   Term  0   Preterm  0   AB  0   Living  0      SAB  0   IAB  0   Ectopic  0   Multiple  0   Live Births  0            Home Medications    Prior to Admission medications   Medication Sig Start Date End Date Taking? Authorizing Provider  azelastine (OPTIVAR) 0.05 % ophthalmic solution  Place 1 drop into the left eye 2 (two) times daily for 7 days. 04/18/23 04/25/23 Yes Radford Pax, NP  doxycycline (VIBRAMYCIN) 100 MG capsule Take 1 capsule (100 mg total) by mouth 2 (two) times daily for 7 days. 04/18/23 04/25/23 Yes Radford Pax, NP  promethazine-dextromethorphan (PROMETHAZINE-DM) 6.25-15 MG/5ML syrup Take 5 mLs by mouth 4 (four) times daily as needed for cough. 04/18/23  Yes Radford Pax, NP  sertraline (ZOLOFT) 50 MG tablet Take 1 tablet by mouth daily. 09/17/22  Yes [provider]  levETIRAcetam (KEPPRA) 500 MG tablet Take 250 mg by mouth daily.  06/05/17   [provider]    Family History Family History  Problem Relation Age of Onset   Hypertension Mother    Thyroid disease Mother        hypo   Hyperlipidemia Mother    Heart attack Maternal Aunt 32   Thyroid disease Maternal Aunt        hyper   Breast cancer Maternal Grandmother 50   Depression Maternal Grandmother    Epilepsy Maternal Grandmother    Heart disease Maternal Grandfather  Social History Social History   Tobacco Use   Smoking status: Never   Smokeless tobacco: Never  Vaping Use   Vaping status: Some Days  Substance Use Topics   Alcohol use: Yes    Alcohol/week: 3.0 standard drinks of alcohol    Types: 3 Standard drinks or equivalent per week   Drug use: Yes    Types: Marijuana    Comment: Marijuana     Allergies   Patient has no known allergies.   Review of Systems Review of Systems  HENT:  Positive for sore throat.   Eyes:  Positive for discharge.  Respiratory:  Positive for cough.      Physical Exam Triage Vital Signs ED Triage Vitals [04/18/23 1529]  Encounter Vitals Group     BP 101/72     Systolic BP Percentile      Diastolic BP Percentile      Pulse Rate 65     Resp 16     Temp 99 F (37.2 C)     Temp Source Oral     SpO2 98 %     Weight      Height      Head Circumference      Peak Flow      Pain Score      Pain Loc      Pain Education       Exclude from Growth Chart    No data found.  Updated Vital Signs BP 101/72 (BP Location: Right Arm)   Pulse 65   Temp 99 F (37.2 C) (Oral)   Resp 16   LMP 03/28/2023 (Exact Date)   SpO2 98%   Visual Acuity Right Eye Distance:   Left Eye Distance:   Bilateral Distance:    Right Eye Near:   Left Eye Near:    Bilateral Near:     Physical Exam Vitals and nursing note reviewed.  Constitutional:      General: She is not in acute distress.    Appearance: She is well-developed. She is not ill-appearing.  HENT:     Head: Normocephalic and atraumatic.     Right Ear: Tympanic membrane and ear canal normal.     Left Ear: Tympanic membrane and ear canal normal.     Nose: Congestion present.     Mouth/Throat:     Mouth: Mucous membranes are moist.     Pharynx: Oropharynx is clear. Uvula midline. Posterior oropharyngeal erythema present.     Tonsils: No tonsillar exudate or tonsillar abscesses.  Eyes:     General: Lids are normal.     Conjunctiva/sclera:     Right eye: Right conjunctiva is not injected. No chemosis, exudate or hemorrhage.    Left eye: Left conjunctiva is injected. No chemosis, exudate or hemorrhage.    Pupils: Pupils are equal, round, and reactive to light.  Cardiovascular:     Rate and Rhythm: Normal rate and regular rhythm.     Heart sounds: Normal heart sounds.  Pulmonary:     Effort: Pulmonary effort is normal.     Breath sounds: Normal breath sounds.  Musculoskeletal:     Cervical back: Normal range of motion and neck supple.  Lymphadenopathy:     Cervical: No cervical adenopathy.  Skin:    General: Skin is warm and dry.  Neurological:     General: No focal deficit present.     Mental Status: She is alert and oriented to person, place, and time.  Psychiatric:  Mood and Affect: Mood normal.        Behavior: Behavior normal.      UC Treatments / Results  Labs (all labs ordered are listed, but only abnormal results are displayed) Labs  Reviewed - No data to display  EKG   Radiology No results found.  Procedures Procedures (including critical care time)  Medications Ordered in UC Medications - No data to display  Initial Impression / Assessment and Plan / UC Course  I have reviewed the triage vital signs and the nursing notes.  Pertinent labs & imaging results that were available during my care of the patient were reviewed by me and considered in my medical decision making (see chart for details).     Reviewed exam and symptoms with patient.  No red flags.  Discussed different types of conjunctivitis.  She will continue her previously prescribed ofloxacin eyedrops.  If no improvement after 48 hours can stop and start antihistamine eyedrops as prescribed.  Warm compresses to the eye as needed.  Will start doxycycline given length of symptoms.  Promethazine DM as needed for cough.  Rest and fluids.  PCP follow-up if symptoms do not improve.  ER precautions reviewed and patient verbalized understanding. Final Clinical Impressions(s) / UC Diagnoses   Final diagnoses:  Acute conjunctivitis of left eye, unspecified acute conjunctivitis type  Acute bacterial bronchitis     Discharge Instructions      You may continue your antibiotic eyedrops as previously prescribed.  If you do not have any improvement after 24 to 48 hours you may stop this and start azelastine and histamine eyedrops twice daily for 7 days.  Warm compresses to the eye as needed. Start doxycycline twice daily for 7 days.  Promethazine DM as needed for cough.  Please note this medication make you drowsy.  Do not drink alcohol or drive while taking these medications.  Lots of rest and fluids.  Please follow-up with your PCP if your symptoms do not improve.  Please go to the ER for any worsening symptoms.  Hope you feel better soon!    ED Prescriptions     Medication Sig Dispense Auth. Provider   doxycycline (VIBRAMYCIN) 100 MG capsule Take 1 capsule  (100 mg total) by mouth 2 (two) times daily for 7 days. 14 capsule Radford Pax, NP   promethazine-dextromethorphan (PROMETHAZINE-DM) 6.25-15 MG/5ML syrup Take 5 mLs by mouth 4 (four) times daily as needed for cough. 118 mL Radford Pax, NP   azelastine (OPTIVAR) 0.05 % ophthalmic solution Place 1 drop into the left eye 2 (two) times daily for 7 days. 6 mL Radford Pax, NP      PDMP not reviewed this encounter.   Radford Pax, NP 04/18/23 [phone]

## 2023-07-15 ENCOUNTER — Ambulatory Visit (INDEPENDENT_AMBULATORY_CARE_PROVIDER_SITE_OTHER): Payer: 59 | Admitting: Physician Assistant

## 2023-07-15 ENCOUNTER — Encounter: Payer: Self-pay | Admitting: Physician Assistant

## 2023-07-15 VITALS — BP 110/60 | HR 64 | Resp 18 | Ht 61.0 in | Wt 115.2 lb

## 2023-07-15 DIAGNOSIS — F419 Anxiety disorder, unspecified: Secondary | ICD-10-CM | POA: Diagnosis not present

## 2023-07-15 DIAGNOSIS — G43719 Chronic migraine without aura, intractable, without status migrainosus: Secondary | ICD-10-CM | POA: Diagnosis not present

## 2023-07-15 DIAGNOSIS — F32A Depression, unspecified: Secondary | ICD-10-CM | POA: Diagnosis not present

## 2023-07-15 DIAGNOSIS — Z23 Encounter for immunization: Secondary | ICD-10-CM | POA: Diagnosis not present

## 2023-07-15 DIAGNOSIS — Z87898 Personal history of other specified conditions: Secondary | ICD-10-CM

## 2023-07-15 DIAGNOSIS — R55 Syncope and collapse: Secondary | ICD-10-CM | POA: Diagnosis not present

## 2023-07-15 MED ORDER — SERTRALINE HCL 50 MG PO TABS: 50.0000 mg | ORAL_TABLET | Freq: Every day | ORAL | 3 refills | Status: DC

## 2023-07-15 NOTE — Assessment & Plan Note (Signed)
Maintained on Keppra 250 mg daily.  She has not had seizures since 2018.

## 2023-07-15 NOTE — Assessment & Plan Note (Signed)
They sound like vasovagal episodes but given worsening headaches and history of seizures will refer to neurology.  Will check labs below to rule out anemia, vitamin deficiency, hypoglycemia, thyroid abnormality

## 2023-07-15 NOTE — Assessment & Plan Note (Signed)
Given increase in headaches, history of seizures and to vasovagal episodes referring back to neurology. Will check labs today

## 2023-07-15 NOTE — Progress Notes (Signed)
New patient visit   Patient: Erica Duarte   DOB: 12/02/1995   27 y.o. Female  MRN: [id number] Visit Date: 07/15/2023  Today's healthcare provider: Alfredia Ferguson, PA-C   Cc.  Headaches, syncopal episodes  Subjective    Erica Duarte is a 27 y.o. female who presents today as a new patient to establish care.    Discussed the use of AI scribe software for clinical note transcription with the patient, who gave verbal consent to proceed.  History of Present Illness   The patient, with a past medical history of seizures, presents for establishing care and expressing concerns about recurrent migraines and episodes of passing out. The migraines, which are typically unilateral along her temple occur a couple of times a week. Over-the-counter medications provide little relief, and the migraines often persist until the patient sleeps overnight.  She reports a history of migraines but denies ever being on a regular medication or even a abortive medication for them.  Now if she takes anything over-the-counter is typically Excedrin.  These do not keep her out of work.   The patient also reports two syncopal episodes, both occurring after getting up in the middle of the night. The patient denies any warning signs before passing out, except for a feeling of potential nausea.  Denies any pattern with these 2 episodes she thinks she did consume alcohol 1 night but it was hours before and only 2 beers.  She generally considers to have a very hydrated person.  She does not have any vasovagal episodes or other syncopal episodes during the day.  The patient also mentions a history of seizures, which have been controlled with daily Keppra. The patient has not seen a neurologist regularly and has not had any recent blood work.     Past Medical History:  Diagnosis Date   Basal cell carcinoma 2019   spot on forehead   Depression    Menorrhagia    Migraines    Seizures (HCC)    Past Surgical History:   Procedure Laterality Date   BASAL CELL CARCINOMA EXCISION     forehead   WISDOM TOOTH EXTRACTION     Family Status  Relation Name Status   Mother  Alive   Mat Aunt  Deceased at age 21       hyperthyroid, MI   MGM  Deceased at age 53       breast cancer   Father  Alive   Sister 1/2 Maternal Alive   Brother  Alive   Brother 1/2 Paternal Alive   MGF  Deceased   PGM  Alive   PGF  Deceased   Sister 1/2 Paternal Alive   Brother 1/2 Maternal Alive  No partnership data on file   Family History  Problem Relation Age of Onset   Hypertension Mother    Thyroid disease Mother        hypo   Hyperlipidemia Mother    Heart attack Maternal Aunt 42   Thyroid disease Maternal Aunt        hyper   Breast cancer Maternal Grandmother 60   Depression Maternal Grandmother    Epilepsy Maternal Grandmother    Heart disease Maternal Grandfather    Social History   Socioeconomic History   Marital status: Single    Spouse name: Not on file   Number of children: 0   Years of education: Not on file   Highest education level: Not on file  Occupational History  Occupation: Consulting civil engineer  Tobacco Use   Smoking status: Never   Smokeless tobacco: Never  Vaping Use   Vaping status: Some Days  Substance and Sexual Activity   Alcohol use: Yes    Alcohol/week: 3.0 standard drinks of alcohol    Types: 3 Standard drinks or equivalent per week   Drug use: Yes    Types: Marijuana    Comment: Marijuana   Sexual activity: Yes    Partners: Male    Birth control/protection: Condom  Other Topics Concern   Not on file  Social History Narrative   Patient is a Consulting civil engineer at [employer]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 She is single.   Right-handed   Drinks coffee weekly   Social Determinants of Corporate investment banker Strain: Not on file  Food Insecurity: Not on file  Transportation Needs: Not on file  Physical Activity: Not on file   Stress: Not on file  Social Connections: Not on file   Outpatient Medications Prior to Visit  Medication Sig   levETIRAcetam (KEPPRA) 500 MG tablet Take 250 mg by mouth daily.    [DISCONTINUED] sertraline (ZOLOFT) 50 MG tablet Take 1 tablet by mouth daily.   [DISCONTINUED] promethazine-dextromethorphan (PROMETHAZINE-DM) 6.25-15 MG/5ML syrup Take 5 mLs by mouth 4 (four) times daily as needed for cough. (Patient not taking: Reported on 07/15/2023)   No facility-administered medications prior to visit.   No Known Allergies  Immunization History  Administered Date(s) Administered   DTaP 06/24/1995, 10/31/1995, 08/25/1996, 10/13/1996   HIB (PRP-OMP) 07/07/1996   Hepatitis B, PED/ADOLESCENT 04/11/1995, 05/23/1995, 01/06/1996   Influenza,inj,Quad PF,6+ Mos 07/09/2019   Influenza-Unspecified 07/04/2020, 07/10/2023   MMR 04/06/1996, 03/19/2013   Meningococcal Conjugate 03/19/2013   PPD Test 02/17/2014, 04/21/2022   Pfizer(Comirnaty)Fall Seasonal Vaccine 12 years and older 12/22/2019, 01/19/2020, 08/25/2020   Td 05/05/2006, 07/15/2023   Tdap 03/09/2013    Health Maintenance  Topic Date Due   Cervical Cancer Screening (Pap smear)  06/18/2020   COVID-19 Vaccine (3 - 2023-24 season) 05/11/2023   DTaP/Tdap/Td (7 - Td or Tdap) 07/14/2033   INFLUENZA VACCINE  Completed   Hepatitis C Screening  Completed   HIV Screening  Completed   HPV VACCINES  Aged Out    Patient Care Team: Alfredia Ferguson, Cordelia Poche as PCP - General (Physician Assistant) Collene Gobble, MD as Consulting Physician (Family Medicine)  Review of Systems  Constitutional:  Negative for fatigue and fever.  Respiratory:  Negative for cough and shortness of breath.   Cardiovascular:  Negative for chest pain and leg swelling.  Gastrointestinal:  Negative for abdominal pain.  Neurological:  Positive for syncope and headaches. Negative for dizziness.        Objective    BP 110/60 (BP Location: Left Arm, Patient Position:  Sitting, Cuff Size: Normal)   Pulse 64   Resp 18   Ht 5\' 1"  (1.549 m)   Wt 115 lb 3.2 oz (52.3 kg)   SpO2 99%   BMI 21.77 kg/m     Physical Exam Constitutional:      General: She is awake.     Appearance: She is well-developed.  HENT:     Head: Normocephalic.  Eyes:     Extraocular Movements: Extraocular movements intact.     Conjunctiva/sclera: Conjunctivae normal.     Pupils: Pupils are equal, round, and reactive to light.  Cardiovascular:     Rate and  Rhythm: Normal rate and regular rhythm.     Heart sounds: Normal heart sounds.  Pulmonary:     Effort: Pulmonary effort is normal.     Breath sounds: Normal breath sounds.  Skin:    General: Skin is warm.  Neurological:     Mental Status: She is alert and oriented to person, place, and time.  Psychiatric:        Attention and Perception: Attention normal.        Mood and Affect: Mood normal.        Speech: Speech normal.        Behavior: Behavior is cooperative.     Depression Screen    07/15/2023    3:38 PM 03/05/2016    1:19 PM  PHQ 2/9 Scores  PHQ - 2 Score 0 0   No results found for any visits on 07/15/23.  Assessment & Plan     Intractable chronic migraine without aura and without status migrainosus Assessment & Plan: Given increase in headaches, history of seizures and to vasovagal episodes referring back to neurology. Will check labs today  Orders: -     Ambulatory referral to Neurology -     CBC with Differential/Platelet -     Comprehensive metabolic panel -     Hemoglobin A1c -     IBC + Ferritin -     Vitamin B12 -     TSH -     T4, free  Syncope, unspecified syncope type Assessment & Plan: They sound like vasovagal episodes but given worsening headaches and history of seizures will refer to neurology.  Will check labs below to rule out anemia, vitamin deficiency, hypoglycemia, thyroid abnormality  Orders: -     CBC with Differential/Platelet -     Comprehensive metabolic panel -      Hemoglobin A1c -     IBC + Ferritin -     TSH -     T4, free  History of seizures Assessment & Plan: Maintained on Keppra 250 mg daily.  She has not had seizures since 2018.   Orders: -     Ambulatory referral to Neurology -     Levetiracetam level  Anxiety and depression Assessment & Plan: Chronic, stable on Zoloft 50 mg daily.  She does not see a therapist.  Orders: -     Sertraline HCl; Take 1 tablet (50 mg total) by mouth daily.  Dispense: 90 tablet; Refill: 3  Immunization due -     Td vaccine greater than or equal to 7yo preservative free IM   Recommending patient follow-up for annual physical exam and Pap smear in the future.  Return in about 3 months (around 10/15/2023) for CPE with pap.      Alfredia Ferguson, PA-C  [facility] [phone] (phone) [phone] (fax)  [facility]

## 2023-07-15 NOTE — Assessment & Plan Note (Signed)
Chronic, stable on Zoloft 50 mg daily.  She does not see a therapist.

## 2023-07-16 LAB — IBC + FERRITIN
Ferritin: 30.9 ng/mL (ref 10.0–291.0)
Iron: 123 ug/dL (ref 42–145)
Saturation Ratios: 25.1 % (ref 20.0–50.0)
TIBC: 490 ug/dL — ABNORMAL HIGH (ref 250.0–450.0)
Transferrin: 350 mg/dL (ref 212.0–360.0)

## 2023-07-16 LAB — VITAMIN B12: Vitamin B-12: 284 pg/mL (ref 211–911)

## 2023-07-16 LAB — CBC WITH DIFFERENTIAL/PLATELET
Basophils Absolute: 0 10*3/uL (ref 0.0–0.1)
Basophils Relative: 0.7 % (ref 0.0–3.0)
Eosinophils Absolute: 0.1 10*3/uL (ref 0.0–0.7)
Eosinophils Relative: 1.1 % (ref 0.0–5.0)
HCT: 41.6 % (ref 36.0–46.0)
Hemoglobin: 14 g/dL (ref 12.0–15.0)
Lymphocytes Relative: 44.5 % (ref 12.0–46.0)
Lymphs Abs: 2.6 10*3/uL (ref 0.7–4.0)
MCHC: 33.6 g/dL (ref 30.0–36.0)
MCV: 91.5 fL (ref 78.0–100.0)
Monocytes Absolute: 0.5 10*3/uL (ref 0.1–1.0)
Monocytes Relative: 8.1 % (ref 3.0–12.0)
Neutro Abs: 2.6 10*3/uL (ref 1.4–7.7)
Neutrophils Relative %: 45.6 % (ref 43.0–77.0)
Platelets: 245 10*3/uL (ref 150.0–400.0)
RBC: 4.54 Mil/uL (ref 3.87–5.11)
RDW: 13.6 % (ref 11.5–15.5)
WBC: 5.7 10*3/uL (ref 4.0–10.5)

## 2023-07-16 LAB — COMPREHENSIVE METABOLIC PANEL
ALT: 19 U/L (ref 0–35)
AST: 22 U/L (ref 0–37)
Albumin: 5 g/dL (ref 3.5–5.2)
Alkaline Phosphatase: 40 U/L (ref 39–117)
BUN: 11 mg/dL (ref 6–23)
CO2: 26 meq/L (ref 19–32)
Calcium: 9.7 mg/dL (ref 8.4–10.5)
Chloride: 100 meq/L (ref 96–112)
Creatinine, Ser: 0.64 mg/dL (ref 0.40–1.20)
GFR: 120.38 mL/min (ref >60.00)
Glucose, Bld: 78 mg/dL (ref 70–99)
Potassium: 4.2 meq/L (ref 3.5–5.1)
Sodium: 136 meq/L (ref 135–145)
Total Bilirubin: 0.5 mg/dL (ref 0.2–1.2)
Total Protein: 7.3 g/dL (ref 6.0–8.3)

## 2023-07-16 LAB — HEMOGLOBIN A1C: Hgb A1c MFr Bld: 5 % (ref 4.6–6.5)

## 2023-07-16 LAB — TSH: TSH: 1.23 u[IU]/mL (ref 0.35–5.50)

## 2023-07-16 LAB — T4, FREE: Free T4: 0.65 ng/dL (ref 0.60–1.60)

## 2023-07-19 ENCOUNTER — Encounter: Payer: Self-pay | Admitting: Physician Assistant

## 2023-07-19 LAB — LEVETIRACETAM LEVEL: Keppra (Levetiracetam): 6.2 ug/mL — ABNORMAL LOW

## 2023-10-15 ENCOUNTER — Telehealth: Payer: Self-pay | Admitting: Physician Assistant

## 2023-10-15 NOTE — Telephone Encounter (Signed)
 Copied from CRM [id number]. Topic: Referral - Request for Referral >> Oct 15, 2023  3:03 PM Benton KIDD wrote: Did the patient discuss referral with their provider in the last year? Yes (If No - schedule appointment) (If Yes - send message)  Appointment offered? Yes  Type of order/referral and detailed reason for visit: neurology  Preference of office, provider, location: patient says  it doesn't matter the location   If referral order, have you been seen by this specialty before? No (If Yes, this issue or another issue? When? Where?  Can we respond through MyChart? Yes

## 2023-10-16 ENCOUNTER — Other Ambulatory Visit: Payer: Self-pay | Admitting: Physician Assistant

## 2023-10-16 DIAGNOSIS — R55 Syncope and collapse: Secondary | ICD-10-CM

## 2023-10-16 DIAGNOSIS — Z87898 Personal history of other specified conditions: Secondary | ICD-10-CM

## 2023-10-16 DIAGNOSIS — G43719 Chronic migraine without aura, intractable, without status migrainosus: Secondary | ICD-10-CM

## 2023-10-16 NOTE — Telephone Encounter (Signed)
 [place] referral placed again.

## 2023-10-20 ENCOUNTER — Encounter: Payer: Self-pay | Admitting: Neurology

## 2023-10-31 ENCOUNTER — Other Ambulatory Visit: Payer: Self-pay | Admitting: Physician Assistant

## 2023-10-31 NOTE — Telephone Encounter (Signed)
Copied from CRM [id number]. Topic: Clinical - Medication Refill >> Oct 31, 2023  2:47 PM Corin V wrote: Most Recent Primary Care Visit:  Provider: Alfredia Ferguson  Department: [facility]  Visit Type: NEW PATIENT  Date: 07/15/2023  Medication: levETIRAcetam (KEPPRA) 500 MG tablet  Has the patient contacted their pharmacy? Yes (Agent: If no, request that the patient contact the pharmacy for the refill. If patient does not wish to contact the pharmacy document the reason why and proceed with request.) (Agent: If yes, when and what did the pharmacy advise?)  Is this the correct pharmacy for this prescription? Yes If no, delete pharmacy and type the correct one.  This is the patient's preferred pharmacy:  [id number] [place], [place] - [address] [place] [address] Phone: [phone] Fax: [phone]   Has the prescription been filled recently? No  Is the patient out of the medication? Yes  Has the patient been seen for an appointment in the last year OR does the patient have an upcoming appointment? Yes  Can we respond through MyChart? Yes  Agent: Please be advised that Rx refills may take up to 3 business days. We ask that you follow-up with your pharmacy.

## 2023-11-04 ENCOUNTER — Encounter: Payer: Self-pay | Admitting: Physician Assistant

## 2023-11-04 ENCOUNTER — Other Ambulatory Visit: Payer: Self-pay | Admitting: Physician Assistant

## 2023-11-04 NOTE — Progress Notes (Deleted)
 NEUROLOGY CONSULTATION NOTE  Erica Duarte MRN: [id number] DOB: 1996-07-01  Referring provider: Alfredia Ferguson, PA-C Primary care provider: Alfredia Ferguson, PA-C  Reason for consult:  migraines, syncope and history of seizures  Assessment/Plan:   ***   Subjective:  Erica Duarte is a 28 year old ***-handed female with migraines, depression and history of seizures and basal cell carcinoma (on forehead) who presents for migraines and recurrent syncope with history of seizures.  History supplemented by referring provider's note.  MRI of brain from 2017 personally reviewed.  Migraines: Onset:  *** Location:  *** Quality:  *** Intensity:  ***.  *** denies new headache, thunderclap headache or severe headache that wakes *** from sleep. Aura:  *** Prodrome:  *** Postdrome:  *** Associated symptoms:  ***.  *** denies associated unilateral numbness or weakness. Duration:  *** Frequency:  *** Frequency of abortive medication: *** Triggers:  *** Relieving factors:  *** Activity:  ***  Seizure disorder: ***  EEG on 03/05/2016 was normal.  She had an MRI of the brain without contrast on 03/10/2016 which revealed less than 10 scattered punctate T2/FLAIR foci in the frontal white matter favored to be secondary to migraine but no intracranial mass lesion or other acute abnormality.  Controlled on Keppra.  Last seizure ***  Syncope: ***   Past NSAIDS/analgesics:  *** Past abortive triptans:  *** Past abortive ergotamine:  *** Past muscle relaxants:  *** Past anti-emetic:  *** Past antihypertensive medications:  *** Past antidepressant medications:  *** Past anticonvulsant medications:  *** Past anti-CGRP:  *** Past vitamins/Herbal/Supplements:  *** Past antihistamines/decongestants:  *** Other past therapies:  ***  Current NSAIDS/analgesics:  *** Current triptans:  none Current ergotamine:  none Current anti-emetic:  none Current muscle relaxants:  none Current Antihypertensive  medications:  none Current Antidepressant medications:  sertraline 50mg  daily Current Anticonvulsant medications:  Keppra 250mg  daily Current anti-CGRP:  none Current Vitamins/Herbal/Supplements:  none Current Antihistamines/Decongestants:  none Other therapy:  none Birth control:  none   Caffeine:  *** Alcohol:  *** Smoker:  *** Diet:  *** Exercise:  *** Depression:  ***; Anxiety:  *** Other pain:  *** Sleep hygiene:  *** Family history of headache:  ***      PAST MEDICAL HISTORY: Past Medical History:  Diagnosis Date   Basal cell carcinoma 2019   spot on forehead   Depression    Menorrhagia    Migraines    Seizures (HCC)     PAST SURGICAL HISTORY: Past Surgical History:  Procedure Laterality Date   BASAL CELL CARCINOMA EXCISION     forehead   WISDOM TOOTH EXTRACTION      MEDICATIONS: Current Outpatient Medications on File Prior to Visit  Medication Sig Dispense Refill   levETIRAcetam (KEPPRA) 500 MG tablet Take 250 mg by mouth daily.   5   sertraline (ZOLOFT) 50 MG tablet Take 1 tablet (50 mg total) by mouth daily. 90 tablet 3   No current facility-administered medications on file prior to visit.    ALLERGIES: No Known Allergies  FAMILY HISTORY: Family History  Problem Relation Age of Onset   Hypertension Mother    Thyroid disease Mother        hypo   Hyperlipidemia Mother    Heart attack Maternal Aunt 38   Thyroid disease Maternal Aunt        hyper   Breast cancer Maternal Grandmother 68   Depression Maternal Grandmother    Epilepsy Maternal Grandmother    Heart  disease Maternal Grandfather     Objective:  *** General: No acute distress.  Patient appears well-groomed.   Head:  Normocephalic/atraumatic Eyes:  fundi examined but not visualized Neck: supple, no paraspinal tenderness, full range of motion Back: No paraspinal tenderness Heart: regular rate and rhythm Lungs: Clear to auscultation bilaterally. Vascular: No carotid  bruits. Neurological Exam: Mental status: alert and oriented to person, place, and time, speech fluent and not dysarthric, language intact. Cranial nerves: CN I: not tested CN II: pupils equal, round and reactive to light, visual fields intact CN III, IV, VI:  full range of motion, no nystagmus, no ptosis CN V: facial sensation intact. CN VII: upper and lower face symmetric CN VIII: hearing intact CN IX, X: gag intact, uvula midline CN XI: sternocleidomastoid and trapezius muscles intact CN XII: tongue midline Bulk & Tone: normal, no fasciculations. Motor:  muscle strength 5/5 throughout Sensation:  Pinprick, temperature and vibratory sensation intact. Deep Tendon Reflexes:  2+ throughout,  toes downgoing.   Finger to nose testing:  Without dysmetria.   Heel to shin:  Without dysmetria.   Gait:  Normal station and stride.  Romberg negative.    Thank you for allowing me to take part in the care of this patient.  Shon Millet, DO  CC: ***

## 2023-11-04 NOTE — Telephone Encounter (Signed)
 Patient is put of the medication she needs some to last until her appt patient would like a call back

## 2023-11-04 NOTE — Telephone Encounter (Signed)
 Patient reports she is taking 500 mg bid and she got an emergency dose from the pharmacy. She has appointment with neuro tomorrow

## 2023-11-05 ENCOUNTER — Ambulatory Visit: Payer: 59 | Admitting: Neurology

## 2023-11-06 ENCOUNTER — Other Ambulatory Visit: Payer: Self-pay | Admitting: Physician Assistant

## 2023-11-06 DIAGNOSIS — F32A Depression, unspecified: Secondary | ICD-10-CM

## 2023-11-06 MED ORDER — LEVETIRACETAM 500 MG PO TABS: 500.0000 mg | ORAL_TABLET | Freq: Two times a day (BID) | ORAL | 0 refills | Status: DC

## 2023-11-06 NOTE — Telephone Encounter (Signed)
 Copied from CRM [id number]. Topic: Clinical - Medication Refill >> Nov 06, 2023  4:17 PM Sonny Dandy B wrote: Most Recent Primary Care Visit:  Provider: Alfredia Ferguson  Department: [facility]  Visit Type: NEW PATIENT  Date: 07/15/2023  Medication: levETIRAcetam (KEPPRA) 500 MG tablet  Has the patient contacted their pharmacy? Yes (Agent: If no, request that the patient contact the pharmacy for the refill. If patient does not wish to contact the pharmacy document the reason why and proceed with request.) (Agent: If yes, when and what did the pharmacy advise?)  Is this the correct pharmacy for this prescription? Yes If no, delete pharmacy and type the correct one.  This is the patient's preferred pharmacy:  [id number] [place], [place] - [address] [place] [address] Phone: [phone] Fax: [phone]   Has the prescription been filled recently? Yes  Is the patient out of the medication? Yes  Has the patient been seen for an appointment in the last year OR does the patient have an upcoming appointment? Yes  Can we respond through MyChart? Yes  Agent: Please be advised that Rx refills may take up to 3 business days. We ask that you follow-up with your pharmacy.

## 2023-11-06 NOTE — Telephone Encounter (Signed)
 Copied from CRM [id number]. Topic: Clinical - Medication Refill >> Nov 06, 2023  4:25 PM Alcus Dad wrote: Most Recent Primary Care Visit:  Provider: Alfredia Ferguson  Department: [facility]  Visit Type: NEW PATIENT  Date: 07/15/2023  Medication: levETIRAcetam (KEPPRA) 500 MG tablet  Has the patient contacted their pharmacy? Yes (Agent: If no, request that the patient contact the pharmacy for the refill. If patient does not wish to contact the pharmacy document the reason why and proceed with request.) (Agent: If yes, when and what did the pharmacy advise?)  Is this the correct pharmacy for this prescription? Yes If no, delete pharmacy and type the correct one.  This is the patient's preferred pharmacy:  [id number] [place], [place] - [address] [place] [address] Phone: [phone] Fax: [phone]   Has the prescription been filled recently? No  Is the patient out of the medication? Yes  Has the patient been seen for an appointment in the last year OR does the patient have an upcoming appointment? Yes  Can we respond through MyChart? Yes  Agent: Please be advised that Rx refills may take up to 3 business days. We ask that you follow-up with your pharmacy.

## 2023-11-06 NOTE — Progress Notes (Unsigned)
 NEUROLOGY CONSULTATION NOTE  Erica Duarte MRN: [id number] DOB: 1995-12-11  Referring provider: Alfredia Ferguson, PA-C Primary care provider: Alfredia Ferguson, PA-C  Reason for consult:  migraines, syncope and history of seizures  Assessment/Plan:   Migraine without aura, without status migrainosus, not intractable Seizure disorder, stable Recurrent syncope   Migraine prevention:  Not indicated Migraine rescue:  Rizatriptan 10mg , Zofran 4mg  Seizure prophylaxis:  levetiracetam 500mg  twice daily Refer to cardiology for evaluation of recurrent syncope Follow up 6 months.   Subjective:  Erica Duarte is a 28 year old right-handed female with migraines, depression and history of seizures and basal cell carcinoma (on forehead) who presents for migraines and recurrent syncope with history of seizures.  History supplemented by referring provider's note.  MRI of brain from 2017 personally reviewed.  Migraines: Onset:  around 2017-2018 Location:  bilateral/either side temple/face Quality:  throbbing Intensity:  7/10 Aura:  absent Prodrome:  absent Associated symptoms:  Nausea, photophobia, phonophobia, dizziness.  She denies associated vomiting, osmophobia, neck pain, unilateral numbness or weakness. Duration:  several hours to all day Frequency:  Varies.  May have periods where they may occur frequently.   Frequency of abortive medication: May occur 1 to 4 times a month Triggers:  dehydration Relieving factors:  rest in a dark room Activity:  aggravates.  Seizure disorder: Started in 2016.  Loses consciousness and falls.  Generalized convulsions.  Lasts a couple of minutes.  Does have tongue biting.  No incontinence.  Afterwards, wakes up with headache and some postictal confusion (word-finding difficulty).  EEG on 03/05/2016 was normal.  She had an MRI of the brain without contrast on 03/10/2016 which revealed less than 10 scattered punctate T2/FLAIR foci in the frontal white matter  favored to be secondary to migraine but no intracranial mass lesion or other acute abnormality.  She had 2 or 3 prior to starting on Keppra.  Controlled on Keppra.  Last seizure in 2018.  When she has had lapses in taking it, she may feel episodes of "detached" feeling.    No history of head trauma, meningitis/encephalitis or febrile seizures.  Her maternal grandmother had seizures.    Syncope: Several times over the years.  Usually when she stands up but may occur also while already standing.  She gets hot, sweaty and lightheaded and vision gets purple and falls and she may pass out for a second.  Last just a few seconds.  She reports feeling her heart racing when it occurs.  No chest pain.  It occurs about once a year.     Past NSAIDS/analgesics:  ibuprofen, Tylenol Past abortive triptans:  none Past abortive ergotamine:  none Past muscle relaxants:  none Past anti-emetic:  none Past antihypertensive medications:  none Past antidepressant medications:  none Past anticonvulsant medications:  none Past anti-CGRP:  none Other past therapies:  none  Current NSAIDS/analgesics:  Excedrin Migraine Current triptans:  none Current ergotamine:  none Current anti-emetic:  none Current muscle relaxants:  none Current Antihypertensive medications:  none Current Antidepressant medications:  sertraline 50mg  daily Current Anticonvulsant medications:  Keppra 250mg  daily Current anti-CGRP:  none Current Vitamins/Herbal/Supplements:  none Current Antihistamines/Decongestants:  none Other therapy:  none Birth control:  none   Caffeine:  Coffee or Celsius.  Not daily Alcohol:  few drinks a week.   Smoker:  sometimes vape Diet:  2 L water daily.  May skip meals Exercise:  active but no routine exercise. Depression:  stable; Anxiety:  stable Other pain:  no  Sleep hygiene:  good.  Sleeps 6-8 hours a night.   Family history of headache:  unaware      PAST MEDICAL HISTORY: Past Medical  History:  Diagnosis Date   Basal cell carcinoma 2019   spot on forehead   Depression    Menorrhagia    Migraines    Seizures (HCC)     PAST SURGICAL HISTORY: Past Surgical History:  Procedure Laterality Date   BASAL CELL CARCINOMA EXCISION     forehead   WISDOM TOOTH EXTRACTION      MEDICATIONS: Current Outpatient Medications on File Prior to Visit  Medication Sig Dispense Refill   levETIRAcetam (KEPPRA) 500 MG tablet Take 500 mg by mouth 2 (two) times daily.     sertraline (ZOLOFT) 50 MG tablet Take 1 tablet (50 mg total) by mouth daily. 90 tablet 3   No current facility-administered medications on file prior to visit.    ALLERGIES: No Known Allergies  FAMILY HISTORY: Family History  Problem Relation Age of Onset   Hypertension Mother    Thyroid disease Mother        hypo   Hyperlipidemia Mother    Heart attack Maternal Aunt 20   Thyroid disease Maternal Aunt        hyper   Breast cancer Maternal Grandmother 41   Depression Maternal Grandmother    Epilepsy Maternal Grandmother    Heart disease Maternal Grandfather     Objective:  Blood pressure 114/76, pulse 80, height 5\' 1"  (1.549 m), weight 115 lb (52.2 kg). General: No acute distress.  Patient appears well-groomed.   Head:  Normocephalic/atraumatic Eyes:  fundi examined but not visualized Neck: supple, no paraspinal tenderness, full range of motion Heart: regular rate and rhythm Vascular: No carotid bruits. Neurological Exam: Mental status: alert and oriented to person, place, and time, speech fluent and not dysarthric, language intact. Cranial nerves: CN I: not tested CN II: pupils equal, round and reactive to light, visual fields intact CN III, IV, VI:  full range of motion, no nystagmus, no ptosis CN V: facial sensation intact. CN VII: upper and lower face symmetric CN VIII: hearing intact CN IX, X: gag intact, uvula midline CN XI: sternocleidomastoid and trapezius muscles intact CN XII: tongue  midline Bulk & Tone: normal, no fasciculations. Motor:  muscle strength 5/5 throughout Sensation:  Pinprick and vibratory sensation intact. Deep Tendon Reflexes:  2+ throughout,  toes downgoing.   Finger to nose testing:  Without dysmetria.   Gait:  Normal station and stride.  Romberg negative.    Thank you for allowing me to take part in the care of this patient.  Shon Millet, DO  CC: Erica Ferguson, PA-C

## 2023-11-07 ENCOUNTER — Ambulatory Visit (INDEPENDENT_AMBULATORY_CARE_PROVIDER_SITE_OTHER): Payer: 59 | Admitting: Neurology

## 2023-11-07 ENCOUNTER — Encounter: Payer: Self-pay | Admitting: Neurology

## 2023-11-07 VITALS — BP 114/76 | HR 80 | Ht 61.0 in | Wt 115.0 lb

## 2023-11-07 DIAGNOSIS — G43001 Migraine without aura, not intractable, with status migrainosus: Secondary | ICD-10-CM

## 2023-11-07 DIAGNOSIS — G40909 Epilepsy, unspecified, not intractable, without status epilepticus: Secondary | ICD-10-CM

## 2023-11-07 DIAGNOSIS — R55 Syncope and collapse: Secondary | ICD-10-CM | POA: Diagnosis not present

## 2023-11-07 MED ORDER — RIZATRIPTAN BENZOATE 10 MG PO TABS
10.0000 mg | ORAL_TABLET | ORAL | 5 refills | Status: AC | PRN
Start: 2023-11-07 — End: ?

## 2023-11-07 MED ORDER — LEVETIRACETAM 500 MG PO TABS: 500.0000 mg | ORAL_TABLET | Freq: Two times a day (BID) | ORAL | 1 refills | Status: AC

## 2023-11-07 MED ORDER — ONDANSETRON HCL 4 MG PO TABS: 4.0000 mg | ORAL_TABLET | Freq: Three times a day (TID) | ORAL | 5 refills | Status: AC | PRN

## 2023-11-07 NOTE — Patient Instructions (Signed)
 Continue levetiracetam 500mg  twice daily for seizure prevention Take rizatriptan tablet at earliest onset of migraine.  May repeat after 2 hours.  Maximum 2 tablets in 24 hours. Take ondansetron for nausea Refer to cardiology for episodes of passing out. Consider for migraine prevention:  magnesium citrate 400mg  daily, riboflavin 400mg  daily, CoQ10 300mg  daily Limit use of pain relievers to no more than 2 days out of week to prevent risk of rebound or medication-overuse headache. Keep headache diary

## 2023-11-14 ENCOUNTER — Other Ambulatory Visit (HOSPITAL_COMMUNITY): Payer: Self-pay

## 2023-11-14 MED ORDER — RIZATRIPTAN BENZOATE 10 MG PO TABS
10.0000 mg | ORAL_TABLET | ORAL | 5 refills | Status: AC | PRN
  Filled 2023-11-14: qty 10, 30d supply, fill #0
  Filled 2024-04-16: qty 10, 30d supply, fill #1

## 2024-04-16 ENCOUNTER — Other Ambulatory Visit (HOSPITAL_COMMUNITY): Payer: Self-pay

## 2024-04-27 ENCOUNTER — Ambulatory Visit: Admitting: Cardiology

## 2024-05-14 ENCOUNTER — Ambulatory Visit: Payer: 59 | Admitting: Neurology

## 2024-07-15 ENCOUNTER — Other Ambulatory Visit: Payer: Self-pay | Admitting: Neurology

## 2024-07-15 DIAGNOSIS — F32A Depression, unspecified: Secondary | ICD-10-CM

## 2024-07-19 ENCOUNTER — Telehealth: Payer: Self-pay | Admitting: Neurology

## 2024-07-19 NOTE — Telephone Encounter (Signed)
 [facility] Call ID: [id number]  Caller: Self  PH: [phone]  Reason for the call: Caller states she has an RX for Zoloft  and is out of refills. She is completely out. She ran out a few days ago.  She states she has body zaps from not having the medication.  [facility] [address] [place], [place] [address]  [phone]

## 2024-07-20 ENCOUNTER — Ambulatory Visit: Payer: Self-pay

## 2024-07-20 ENCOUNTER — Other Ambulatory Visit: Payer: Self-pay | Admitting: Physician Assistant

## 2024-07-20 DIAGNOSIS — F32A Depression, unspecified: Secondary | ICD-10-CM

## 2024-07-20 MED ORDER — SERTRALINE HCL 50 MG PO TABS: 50.0000 mg | ORAL_TABLET | Freq: Every day | ORAL | 3 refills | Status: AC

## 2024-07-20 NOTE — Telephone Encounter (Signed)
 FYI Only or Action Required?: FYI only for provider: appointment scheduled on 07/26/24 at [facility] (Pt in [place], [place]).  Patient was last seen in primary care on 07/15/2023 by Cyndi Shaver, PA-C.  Called Nurse Triage reporting Anxiety.  Symptoms began several days ago.  Interventions attempted: Prescription medications: Zoloft .  Symptoms are: gradually worsening.  Triage Disposition: See PCP Within 2 Weeks  Patient/caregiver understands and will follow disposition?: Yes  Copied from CRM [id number]. Topic: Clinical - Red Word Triage >> Jul 20, 2024  4:04 PM Fonda T wrote: Red Word that prompted transfer to Nurse Triage: Patient reports she has had symptoms, in the past few days, of increased anxiety, and body shock since being without anxiety medication.  Pharmacy returned medication, and has now expired.  Medication: sertraline  (ZOLOFT ) 50 MG tablet Reason for Disposition  [1] Symptoms of anxiety or panic attack AND [2] is a chronic symptom (recurrent or ongoing AND present > 4 weeks)  Answer Assessment - Initial Assessment Questions Pt with hx of anxiety reports running out of zoloft  1 week ago. Was able to get emergency rx over the weekend, ran out yesterday. Reports increased anxiety and feeling of mild electric shocks all over when not taking zoloft , resolves when she restarts zoloft . Anxiety 4/10 and no thoughts of harming self. Pt asking if she can have another limited refill issued. No longer established with PCP Morna Cyndi as she is no longer at practice. Pt just moved to [place] and has new patient appt scheduled with [facility] on Monday. Advised pt to go to UC to have limited refill issued to get her through until new pt appt on Monday. Advised UC or ED for worsening symptoms.   1. CONCERN: Did anything happen that prompted you to call today?      Increased anxiety since running out of zoloft   2. ANXIETY SYMPTOMS: Can you describe how you (your loved one;  patient) have been feeling? (e.g., tense, restless, panicky, anxious, keyed up, overwhelmed, sense of impending doom).      Emotional overwhelm  3. ONSET: How long have you been feeling this way? (e.g., hours, days, weeks)     Started a few days after running out of zoloft . About half a week.  4. SEVERITY: How would you rate the level of anxiety? (e.g., 0 - 10; or mild, moderate, severe).     3-4/10. Denies panic attack  5. FUNCTIONAL IMPAIRMENT: How have these feelings affected your ability to do daily activities? Have you had more difficulty than usual doing your normal daily activities? (e.g., getting better, same, worse; self-care, school, work, interactions)     Able to go about daily life and take care of self  6. HISTORY: Have you felt this way before? Have you ever been diagnosed with an anxiety problem in the past? (e.g., generalized anxiety disorder, panic attacks, PTSD). If Yes, ask: How was this problem treated? (e.g., medicines, counseling, etc.)     Yes, when she runs out of Zoloft  notices increased anxiety and what feels like mild electric shocks going through body all over. Resolves when resuming medication.  7. RISK OF HARM - SUICIDAL IDEATION: Do you ever have thoughts of hurting or killing yourself? If Yes, ask:  Do you have these feelings now? Do you have a plan on how you would do this?     Denies  8. TREATMENT:  What has been done so far to treat this anxiety? (e.g., medicines, relaxation strategies). What has helped?  Zoloft   9. THERAPIST: Do you have a counselor or therapist? If Yes, ask: What is their name?     No  10. POTENTIAL TRIGGERS: Do you drink caffeinated beverages (e.g., coffee, colas, teas), and how much daily? Do you drink alcohol or use any drugs? Have you started any new medicines recently?       Work stress, no increase in caffeine  11. PATIENT SUPPORT: Who is with you now? Who do you live with? Do you have  family or friends who you can talk to?        Has a support system. Currently at new home address in [place].   12. OTHER SYMPTOMS: Do you have any other symptoms? (e.g., feeling depressed, trouble concentrating, trouble sleeping, trouble breathing, palpitations or fast heartbeat, chest pain, sweating, nausea, or diarrhea)       Denies  13. PREGNANCY: Is there any chance you are pregnant? When was your last menstrual period?       Denies, LMP yesterday  Protocols used: Anxiety and Panic Attack-A-AH

## 2024-07-20 NOTE — Telephone Encounter (Unsigned)
 Copied from CRM [id number]. Topic: Clinical - Medication Refill >> Jul 20, 2024  3:57 PM Fonda T wrote: Medication: sertraline  (ZOLOFT ) 50 MG tablet  Patient is a former patient of Manuelita Flatness, NP, however provider is no longer at office. Patient states she has an upcoming appointment with [facility] on 07/26/24, but needs about a weeks worth to get her through to scheduled appointment.   Has the patient contacted their pharmacy? Yes, advised to contact office, rx has expired as pt did not pick up from pharmacy in time  This is the patient's preferred pharmacy:   [id number] [place], [place] - [address] [place] [address] Phone: [phone] Fax: [phone]  Is this the correct pharmacy for this prescription? Yes If no, delete pharmacy and type the correct one.   Has the prescription been filled recently? Yes  Is the patient out of the medication? Yes  Has the patient been seen for an appointment in the last year OR does the patient have an upcoming appointment? No, offered pt to schedule an appt, states she has a new pt appt upcoming with [facility]  Can we respond through MyChart? No, prefers phone [phone]  Agent: Please be advised that Rx refills may take up to 3 business days. We ask that you follow-up with your pharmacy.

## 2024-07-23 NOTE — Telephone Encounter (Signed)
 Patient advised of Dr.Jaffe note, If she is unable to make a follow up appointment with me, then unfortunately I cannot refill her medication. She will need to establish care with a provider under her insurance plan.  Per patient another filled her medication.
